# Patient Record
Sex: Male | Born: 1945 | Race: White | Hispanic: No | Marital: Single | Smoking: Former smoker
Health system: Southern US, Community
[De-identification: ages and names within clinical notes are randomized; demographics above are authoritative.]

## PROBLEM LIST (undated history)

## (undated) DIAGNOSIS — I10 Essential (primary) hypertension: Secondary | ICD-10-CM

## (undated) DIAGNOSIS — I2510 Atherosclerotic heart disease of native coronary artery without angina pectoris: Secondary | ICD-10-CM

## (undated) DIAGNOSIS — H9190 Unspecified hearing loss, unspecified ear: Secondary | ICD-10-CM

## (undated) DIAGNOSIS — K219 Gastro-esophageal reflux disease without esophagitis: Secondary | ICD-10-CM

## (undated) DIAGNOSIS — E785 Hyperlipidemia, unspecified: Secondary | ICD-10-CM

## (undated) HISTORY — DX: Essential (primary) hypertension: I10

## (undated) HISTORY — DX: Unspecified hearing loss, unspecified ear: H91.90

## (undated) HISTORY — DX: Hyperlipidemia, unspecified: E78.5

## (undated) HISTORY — DX: Gastro-esophageal reflux disease without esophagitis: K21.9

## (undated) HISTORY — PX: CORONARY ANGIOPLASTY WITH STENT PLACEMENT: SHX49

## (undated) HISTORY — DX: Atherosclerotic heart disease of native coronary artery without angina pectoris: I25.10

## (undated) HISTORY — PX: CARDIAC CATHETERIZATION: SHX172

---

## 2004-08-28 ENCOUNTER — Ambulatory Visit: Payer: Self-pay | Admitting: Cardiology

## 2004-08-29 ENCOUNTER — Ambulatory Visit: Payer: Self-pay | Admitting: Cardiology

## 2004-08-29 ENCOUNTER — Inpatient Hospital Stay (HOSPITAL_COMMUNITY): Admission: AD | Admit: 2004-08-29 | Discharge: 2004-09-01 | Payer: Self-pay | Admitting: Cardiology

## 2004-08-30 ENCOUNTER — Encounter: Payer: Self-pay | Admitting: Cardiology

## 2004-09-10 ENCOUNTER — Ambulatory Visit: Payer: Self-pay | Admitting: Cardiology

## 2004-09-10 ENCOUNTER — Inpatient Hospital Stay (HOSPITAL_COMMUNITY): Admission: AD | Admit: 2004-09-10 | Discharge: 2004-09-15 | Payer: Self-pay | Admitting: Cardiology

## 2004-12-04 ENCOUNTER — Inpatient Hospital Stay (HOSPITAL_COMMUNITY): Admission: AD | Admit: 2004-12-04 | Discharge: 2004-12-09 | Payer: Self-pay | Admitting: Cardiology

## 2004-12-04 ENCOUNTER — Ambulatory Visit: Payer: Self-pay | Admitting: Internal Medicine

## 2004-12-04 ENCOUNTER — Ambulatory Visit: Payer: Self-pay | Admitting: Cardiovascular Disease

## 2005-12-24 IMAGING — CR DG CHEST 1V PORT
1 series · 1 of 1 positions shown · non-contrast
Comparison: none

CLINICAL DATA: Unstable angina. Pre-cath workup.
 PORTABLE CHEST ONE VIEW:  (6476 hours):
 No priors for comparison.
 For this single view, cardiac size is at the upper limits of normal, slightly enlarged. No evidence of acute chest disease or pulmonary vascular congestion.

[view not recorded]
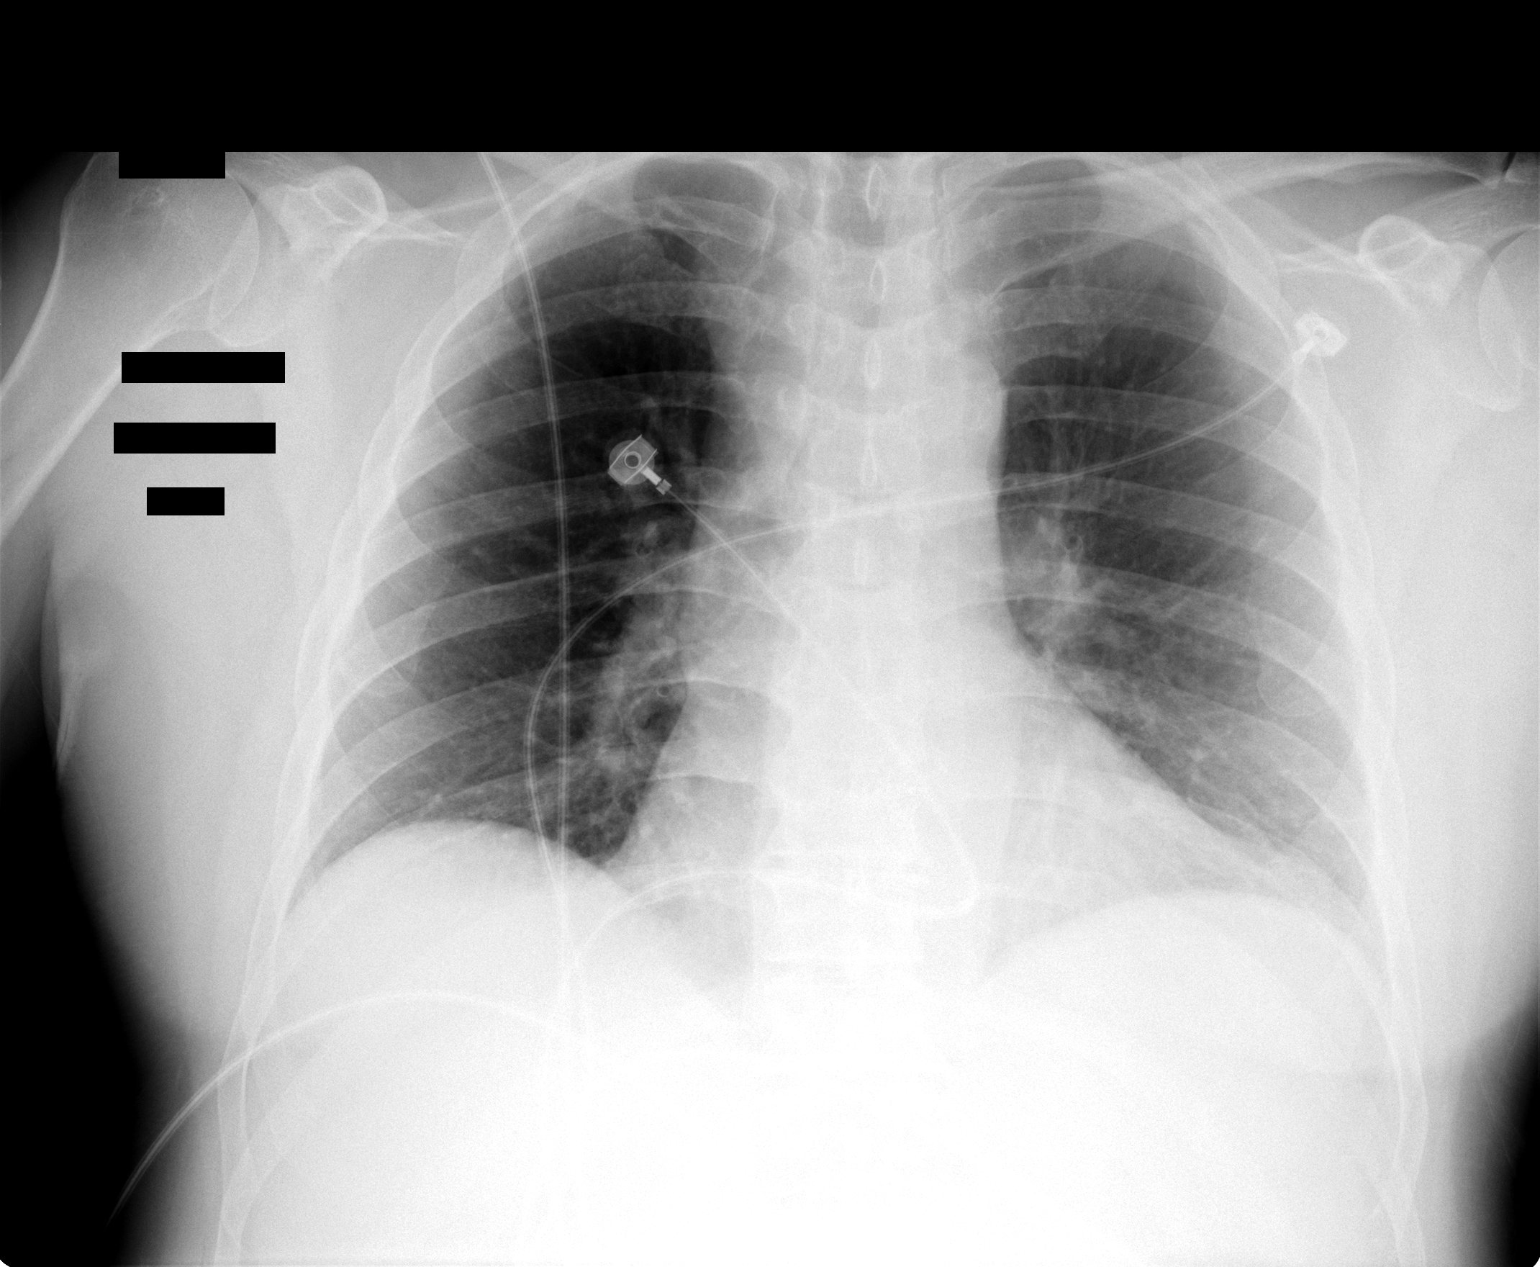

[1 of 1 positions shown; findings below may reference images not displayed]

IMPRESSION: No acute chest disease. Cardiomegaly.

## 2009-04-17 DIAGNOSIS — E109 Type 1 diabetes mellitus without complications: Secondary | ICD-10-CM | POA: Insufficient documentation

## 2010-03-23 ENCOUNTER — Ambulatory Visit: Payer: Self-pay | Admitting: Cardiovascular Disease

## 2010-03-23 ENCOUNTER — Telehealth (INDEPENDENT_AMBULATORY_CARE_PROVIDER_SITE_OTHER): Payer: Self-pay | Admitting: *Deleted

## 2010-03-29 ENCOUNTER — Telehealth: Payer: Self-pay | Admitting: Cardiovascular Disease

## 2010-03-31 ENCOUNTER — Ambulatory Visit: Payer: Self-pay | Admitting: Cardiovascular Disease

## 2010-04-09 ENCOUNTER — Ambulatory Visit: Payer: Self-pay | Admitting: Cardiovascular Disease

## 2010-04-09 ENCOUNTER — Observation Stay (HOSPITAL_COMMUNITY): Admission: EM | Admit: 2010-04-09 | Discharge: 2010-04-10 | Payer: Self-pay | Admitting: Cardiovascular Disease

## 2010-04-26 ENCOUNTER — Telehealth: Payer: Self-pay | Admitting: Cardiovascular Disease

## 2010-04-26 ENCOUNTER — Ambulatory Visit: Payer: Self-pay | Admitting: Cardiovascular Disease

## 2010-10-19 NOTE — Progress Notes (Signed)
  Phone Note Call from Patient   Caller: Patient Call For: nurse Summary of Call: Patient returned call from Thurs. 03/25/10-patient notified per Dr. Kirke Corin, stress test was slightly abnormal.  Needs f/u appt this week.  Per patient has f/u on Wed. 03/31/10.  To keep this appointment. Initial call taken by: Dessie Coma,  March 29, 2010 9:19 AM

## 2010-10-19 NOTE — Progress Notes (Signed)
----   Converted from flag ---- ---- 03/23/2010 3:47 PM, Marilynne Halsted, CMA, AAMA wrote: Pt has [employer], no precert required  ---- 03/23/2010 3:16 PM, Dessie Coma wrote: Nuclear stress test scheduled at  [facility] for 03/25/10 with Dx: 786.50. Thanks, Victorino Dike ------------------------------

## 2010-10-19 NOTE — Progress Notes (Signed)
  Phone Note Outgoing Call   Call placed by: Dessie Coma  LPN,  April 26, 2010 4:02 PM Call placed to: Patient Summary of Call: patient notified per Dr. Kirke Corin to stop taking his protonix.  Patient voiced understanding.

## 2010-10-28 ENCOUNTER — Ambulatory Visit (INDEPENDENT_AMBULATORY_CARE_PROVIDER_SITE_OTHER): Payer: Medicare Other | Admitting: Cardiovascular Disease

## 2010-10-28 DIAGNOSIS — E7800 Pure hypercholesterolemia, unspecified: Secondary | ICD-10-CM

## 2010-10-28 DIAGNOSIS — I2510 Atherosclerotic heart disease of native coronary artery without angina pectoris: Secondary | ICD-10-CM

## 2010-10-28 DIAGNOSIS — I10 Essential (primary) hypertension: Secondary | ICD-10-CM

## 2010-10-29 ENCOUNTER — Telehealth: Payer: Self-pay | Admitting: Cardiovascular Disease

## 2010-11-04 NOTE — Progress Notes (Signed)
Summary: to stay on Ranitidine  Phone Note Call from Patient   Caller: Patient Summary of Call: T.C. from patient-Pantoprazole needing prior authorization.  Has Rx for Ranitidine and is fine with staying on that if he can take twice a day.  Per Dr Kirke Corin, to stay on Ranitidine and take it twice daily.  Patient notified.  l Initial call taken by: Dessie Coma  LPN,  October 29, 2010 10:53 AM

## 2010-12-04 LAB — CBC
HCT: 39.5 % (ref 39.0–52.0)
Hemoglobin: 13.9 g/dL (ref 13.0–17.0)
MCH: 33.1 pg (ref 26.0–34.0)
MCHC: 35.3 g/dL (ref 30.0–36.0)
MCV: 93.8 fL (ref 78.0–100.0)
Platelets: 170 10*3/uL (ref 150–400)
RBC: 4.21 MIL/uL — ABNORMAL LOW (ref 4.22–5.81)
RDW: 12.8 % (ref 11.5–15.5)
WBC: 5.9 10*3/uL (ref 4.0–10.5)

## 2010-12-04 LAB — BASIC METABOLIC PANEL
BUN: 13 mg/dL (ref 6–23)
CO2: 23 mEq/L (ref 19–32)
Calcium: 9 mg/dL (ref 8.4–10.5)
Chloride: 107 mEq/L (ref 96–112)
Creatinine, Ser: 1 mg/dL (ref 0.4–1.5)
GFR calc Af Amer: >60 mL/min (ref >60)
GFR calc non Af Amer: >60 mL/min (ref >60)
Glucose, Bld: 140 mg/dL — ABNORMAL HIGH (ref 70–99)
Potassium: 4 mEq/L (ref 3.5–5.1)
Sodium: 139 mEq/L (ref 135–145)

## 2010-12-10 NOTE — Assessment & Plan Note (Signed)
[facility]                       [place] CARDIOLOGY OFFICE NOTE  NAME:Mccoy, Arin                         MRN:          [id number] DATE:10/28/2010                            DOB:          May 21, 1946   Mr. Mester is a 65 year old male who is here today for a followup visit.  He has the following problem list: 1. Coronary artery disease with a three-vessel coronary artery     disease.  He is status post inferior myocardial infarction in     December 2005.  He is status post angioplasty and a drug-eluting     stent placement to the mid RCA with a Cypher stent followed by     stage LAD angioplasty with a Cypher stent in 2005.  He had     angioplasty with a Taxus stent to the proximal circumflex in 2006.     Most recent cardiac catheterization was done in July 2011.  It     showed 99% in-stent restenosis in the mid LAD with TIMI 2 flow.  He     underwent an angioplasty and drug-eluting stent placement with 2.25     x 15-mm Chromo stent. 2. Hypertension. 3. Hyperlipidemia. 4. Hearing difficulty.  INTERVAL HISTORY:  Mr. Harbaugh is here today for a followup visit. Overall, he has been doing very well with no new symptoms.  He did have recurrent heartburn after he ran out of Protonix.  Otherwise, he has not had any chest pain.  There is no reported dyspnea.  No palpitations, syncope or presyncope.  PHYSICAL EXAMINATION:  VITAL SIGNS:  Weight is 196.8 pounds, blood pressure is 109/65, pulse is 57, oxygen saturation is 96% on room air. NECK:  No JVD or carotid bruits. LUNGS:  Clear to auscultation. HEART:  Regular rate and rhythm with no gallops or murmurs. ABDOMEN:  Benign, nontender, nondistended. EXTREMITIES:  With no clubbing, cyanosis or edema.  IMPRESSION: 1. Coronary artery disease, status post multiple angioplasties and     drug-eluting stents in all 3 coronary arteries.  He is currently     doing well with no recurrent symptoms of angina.   We will continue     lifelong dual antiplatelet therapy with aspirin and Plavix.  We     will continue other heart medications that include metoprolol,     isosorbide and a cholesterol management. 2. Recurrent symptoms of gastroesophageal reflux disease.  We will     resume Protonix 40 mg once daily.  This is the least PPI to have     interaction with Plavix. 3. Hypertension:  Blood pressure is well-controlled. 4. Hyperlipidemia:  His lipid profile is not available for review.     Goal LDL should be less than 70.  He should follow up in 6 months     from now or earlier if needed.    Lorine Bears, MD Electronically Signed   MA/MedQ  DD: 10/28/2010  DT: 10/29/2010  Job #: [id number]

## 2011-02-01 NOTE — Letter (Signed)
March 23, 2010    [facility]  [address]  [address]  [place], [place]  [address]   RE:  Michael, Friedman  MRN:  [id number]  /  DOB:  10-26-1945    REASON FOR CONSULTATION:  Chest pain.   Dear Dr. Orvan Falconer:   Thank you for referring Michael Friedman for cardiac evaluation.  As you are  aware, this is a pleasant 65 year old male who is very hard of hearing.  He has known history of coronary artery disease.  He presented in  December of 2005 with acute inferior myocardial infarction.  At that  time, he underwent angioplasty and stent placement to proximal and mid  right coronary artery with a drug-eluting stent.  He also had a stent  placement to proximal LAD.  He presented in 2006 with recurrent chest  pain and was found to have 70% stenosis in the proximal circumflex.  He  underwent another stent placement at that time.  Since then, he has done  relatively well from a cardiac standpoint.  Most recent stress test was  in March of 2009 which showed evidence of prior inferior infarct with no  significant ischemia.  Most recently the patient started having symptoms  of generalized fatigue, as well as chest pain.  According to him, the  chest pain is happening all the time when he is sitting and not doing  anything.  However, the chest pain does not seem to be happening with  activities.  He describes it as aching.  He denies any heartburn.  He  does have exertional dyspnea as well.  He denies any palpitations or  dizziness.   PAST MEDICAL HISTORY:  1. Coronary artery disease, status post inferior myocardial infarction      in December of 2005.  He underwent angioplasty and a drug-eluting      stent placement to mid RCA with a Cypher stent, 3.5 x 33 mm.  He      also had an LAD angioplasty with Cypher stent placement.  He had a      Taxus stent placement to proximal circumflex in 2006.  This was 3.0      x 16 mm.  2. Preserved LV systolic  function.  3. Hypertension.  4. Hyperlipidemia.  5. Hearing difficulty.  6. No history of diabetes.   MEDICATIONS:  1. WelChol 625 mg 3 tablets twice daily.  2. Isosorbide 30 mg once daily.  3. Ranitidine 150 mg once daily.  4. Metoprolol succinate 25 mg once daily.  5. Lipitor 80 mg at bedtime.  6. Aspirin 81 mg once daily.  7. Plavix 75 mg once daily.   ALLERGIES:  NO KNOWN DRUG ALLERGIES.   SOCIAL HISTORY:  He quit smoking in 1989.  He drinks 2 to 3 beers a day.  He denies any recreational drug use.   FAMILY HISTORY:  Negative for premature coronary artery disease.   PHYSICAL EXAMINATION:  VITAL SIGNS:  Blood pressure is 122/85, heart  rate is 81, weight is 205.2 pounds.  NECK:  Reveals no JVD or carotid bruits.  LUNGS:  Clear to auscultation.  CARDIOVASCULAR:  Normal S1 and S2.  There are no gallops or murmurs.  ABDOMEN:  Benign, nontender, nondistended.  EXTREMITIES:  With no clubbing, cyanosis, or edema.   LABORATORY AND DIAGNOSTIC DATA:  EKG shows normal sinus rhythm with old  inferior infarct.  There are no acute ST or T-wave changes.  His  most  recent lipid profile showed an LDL of 88 and a triglyceride of 163.  His  HDL was 41.   IMPRESSION:  Atypical chest pain with exertional dyspnea:  He has a  known history of 3-vessel coronary artery disease, status post  angioplasty and stent placement to the RCA, LAD, and left circumflex.  All of them were drug-eluting stents.  I agree with lifelong treatment  with a small-dose aspirin and clopidogrel.  He is having some atypical  symptoms that require further evaluation.  In order to risk stratify  him, I recommend proceeding with a nuclear stress test to identify any  areas of ischemia.  Further recommendations will follow after his stress  test.  In the meantime, I agree with  continuous aggressive treatment of  his risk factors.  His blood pressure seems to be under good control.  His lipid profile is above target at  this time.  His LDL ideally should  be less than 70.  However, he is already on a maximum dose of Lipitor  and also using WelChol.  He has a borderline elevation of triglycerides  and borderline low HDL.  I think we will continue with the current  management at this time and recommend aggressive lifestyle changes.  Niaspan can be considered in the future.  He is on good medical therapy  at this time.  I will not add an ACE inhibitor at this time, especially  that his ejection fraction is well preserved according to his most  recent testing.  We will await the results of the stress test.   Thank you for allowing me to participate in the care of your patient.    Sincerely,      Lorine Bears, MD  Electronically Signed    MA/MedQ  DD: 03/23/2010  DT: 03/23/2010  Job #: [id number]

## 2011-02-01 NOTE — Assessment & Plan Note (Signed)
[facility]                         CARDIOLOGY OFFICE NOTE   NAME:Michael Friedman, Michael Friedman                         MRN:          [id number]  DATE:03/31/2010                            DOB:          03-20-1946    PRIMARY CARE PHYSICIAN:  Dr. Orvan Falconer.   INTERVAL HISTORY:  Michael Friedman is here today for followup visit.  I saw  him last week for atypical chest pain.  He does have known history of  coronary artery disease status post coronary artery angioplasty and  stent placements in 2005 can 2006.  His current symptoms are different  from his previous angina.  He is still having hard time keeping details  about his symptoms exactly.  He seems to get few episodes of chest pain,  mostly at rest.  These can happen also at night and when he is lying  down.  He does not get chest pain with activities and when he is working  around the yard.  He has chronic exertional dyspnea.  He does not  exercise regularly.  He does have history of previous GI symptoms and  has been on ranitidine as needed.   PAST MEDICAL HISTORY:  1. Coronary artery disease status post inferior myocardial infarction      in December 2005.  Status post angioplasty and drug-eluting stent      placement to mid RCA with a Cypher stent.  He had a staged LAD      angioplasty with a Cypher stent placement the same year.  He had a      Taxus stent placement to the proximal circumflex in 2006.  2. Preserved LV systolic function.  3. Hypertension.  4. Hyperlipidemia.  5. Hearing difficulty.   CURRENT MEDICATIONS:  1. WelChol 625 mg 3 tablets twice daily.  2. Imdur 30 mg once daily.  3. Ranitidine 150 mg once daily.  4. Metoprolol succinate 25 mg once daily.  5. Lipitor 80 mg at bedtime.  6. Aspirin 81 mg daily.  7. Plavix 75 mg once daily.   PHYSICAL EXAMINATION:  GENERAL APPEARANCE:  The patient is well  nourished and appears to be in no acute distress.  VITAL SIGNS:  Weight is 204.4, blood  pressure in the right arm is  105/66, pulse is 61, oxygen saturation is 96% on room air.  NECK:  Reveals no masses.  There is no carotid bruits.  There is no JVD.  RESPIRATORY:  Normal respiratory effort.  No use of accessory muscles.  Auscultation of lungs reveals normal breath sounds.  CARDIOVASCULAR:  Palpation of the heart reveals normal PMI.  There is  normal S1 and S2 with no gallops or murmurs.  GASTROINTESTINAL:  The exam is benign with no tenderness and no masses.  EXTREMITIES:  No clubbing, cyanosis, or edema.  Peripheral pulses are  slightly diminished.  NEUROLOGIC:  The patient appears to be alert and oriented with no focal  neurologic signs.   IMPRESSION:  1. Atypical chest pain:  The patient has known history of coronary      artery disease status post 3-vessel angioplasty and  stent placement      in 2005 and 2006.  His current symptoms seems to be different from      his previous angina.  He did undergo a nuclear stress test.  There      was no EKG changes during exercise and he had no chest pain at that      time.  In nuclear imaging, there was small apical ischemia with      evidence of prior inferior infarct.  Ejection fraction was normal.      Overall, his stress test is low risk.  Given that his symptoms are      somewhat atypical and a stress test is only slightly abnormal, I      think we will continue with medical therapy for now.  Some of his      symptoms seems to be gastrointestinal in nature and thus, we will      start him on Protonix 40 mg once daily.  I advised him to start an      exercise program and also to start writing down the details about      his symptoms of chest pain given that he is having hard time giving      more detailed history.  The patient will be reevaluated again in 6      weeks from now to ensure resolution of symptoms.  If he continues      to have chest pain, we might ultimately have to proceed with      cardiac catheterization  given his extensive cardiac history.  2. Hyperlipidemia:  Continue with Lipitor and WelChol for now.     Lorine Bears, MD  Electronically Signed    MA/MedQ  DD: 03/31/2010  DT: 03/31/2010  Job #: [id number]   cc:   Dr. Orvan Falconer

## 2011-02-01 NOTE — Assessment & Plan Note (Signed)
[facility]                        [place] CARDIOLOGY OFFICE NOTE   NAME:Mathena, Cammeron                         MRN:          [id number]  DATE:04/26/2010                            DOB:          Jun 16, 1946    PRIMARY CARE PHYSICIAN:  Dr. Orvan Falconer.   INTERVAL HISTORY:  Mr. Kolton was seen by me on March 31, 2010, for a  followup visit.  At that time, his stress test was borderline abnormal.  Initially, it was felt that his symptoms were somewhat atypical and the  plan was to treat him medically and reserve cardiac catheterization for  recurrent symptoms.  However, he presented to [facility] Emergency Room  with chest tightness at rest which was prolonged.  In the emergency room  there, he had negative cardiac enzymes and no new EKG changes.  I was  contacted by the emergency room physician and given his recurrent  symptoms, I decided to transfer him to [facility] for cardiac  catheterization.  Cardiac catheterization was performed and showed 99%  in-stent restenosis in the mid LAD.  He underwent an angioplasty and  drug-eluting stent placement by Dr. Juanda Chance without complications.  Since  then, he feels much better.  Most of his symptoms of chest pain has  resolved.  He denies any dyspnea.  Overall, he has more energy.  His  main complaint at this time is left knee pain and left hip pain which  started a few days ago.  He had this kind of problem before.  He denies  any palpitations, presyncope, or syncope.   PAST MEDICAL HISTORY:  1. Coronary artery disease status post inferior myocardial infarction      in December 2005.  Status post angioplasty and drug-eluting stent      placement to mid RCA with a Cypher stent followed by stage LAD      angioplasty with a Cypher stent in 2005.  He had angioplasty and a      Taxus stent placement to proximal circumflex in 2006.  Most recent      cardiac catheterization was done on April 09, 2010, which showed  99%      in-stent restenosis in the mid LAD with TIMI 2 flow, 30-40%      proximal LAD stenosis, 30-40% proximal circumflex stenosis as well      as 30-40% OM-1 stenosis.  Right coronary artery was dominant and      had 30-40% stenosis in the distal portion of the mid RCA stent.      Ejection fraction was 60%.  He underwent an angioplasty and a drug-      eluting stent placement to mid LAD with 2.25 x 15 mm Promus stent      without complications.  2. Hypertension.  3. Hyperlipidemia.  4. Hearing difficulty.   CURRENT MEDICATIONS:  1. WelChol 625 mg 3 tablets twice daily.  2. Isosorbide 30 mg once daily.  3. Ranitidine 150 mg once daily.  4. Metoprolol succinate 25 mg once daily.  5. Lipitor 80 mg q.h.s.  6. Plavix 75 mg once daily.  7. Protonix 40 mg once daily.  8. Aspirin 325 mg once daily.   ALLERGIES:  No known drug allergies.   PHYSICAL EXAMINATION:  VITAL SIGNS:  Reveals a weight of 203.4 pounds,  blood pressure is 111/74, pulse is 64, oxygen saturation is 96% on room  air.  NECK:  Reveals no JVD or carotid bruits.  LUNGS:  Clear to auscultation.  HEART:  Regular rate and rhythm with no gallops or murmurs.  ABDOMEN:  Benign, nontender, nondistended.  EXTREMITIES:  With no clubbing, cyanosis, or edema.  Right groin reveals  an intact incision site with no hematoma or bruit.   IMPRESSION:  1. Coronary artery disease status post recent angioplasty and drug-      eluting stent placement to mid left anterior descending coronary      artery for severe in-stent restenosis.  He is currently doing much      better.  Most symptoms of chest pain has resolved.  We will      continue with dual-antiplatelet therapy with aspirin and Plavix.      Aspirin can be decreased to 81 mg daily after 3 months.  We will      continue aggressive treatment of risk factors.  I have discussed      with them the importance of cardiac rehab.  He does not think he      can attend.  Thus, I explained  to him that he can do brisk walking      by himself 3-4 times a week with a minimal of 20 minutes at a time.  2. Hyperlipidemia.  He is on high-dose Lipitor and WelChol.  Target      LDL should be less than 70, but he seems to be on maximal therapy      at this time.  If he does not get this checked with Dr. Orvan Falconer,      we can do that next time when he comes for a visit.  3. Hypertension.  Blood pressure is well controlled.  4. Left knee and hip discomfort.  He will follow up with Dr. Orvan Falconer      for evaluation.  We will stop his Protonix at this time given that      most likely the chest pain was due to coronary artery disease and      also due to possible interaction with Plavix.  The patient will      follow up in 6 months from now or earlier if needed.     Lorine Bears, MD  Electronically Signed    MA/MedQ  DD: 04/26/2010  DT: 04/26/2010  Job #: [id number]

## 2011-02-04 NOTE — Cardiovascular Report (Signed)
NAME:  Michael Friedman, Michael Friedman NO.:  [id number]   MEDICAL RECORD NO.:  [id number]          PATIENT TYPE:  INP   LOCATION:  2028                         FACILITY:  [facility]   PHYSICIAN:  Charlies Constable, M.D. [facility] DATE OF BIRTH:  17-Sep-1946   DATE OF PROCEDURE:  09/14/2004  DATE OF DISCHARGE:                              CARDIAC CATHETERIZATION   HISTORY:  Michael Friedman is 65 years old and was hospitalized here on August 29, 2004, after being transferred from [facility] in acute  diaphragmatic myocardial infarction.  He underwent placement of a Cypher  stent in the right coronary artery, and also a Cypher stent in the proximal  mid LAD for 95% stenosis in the LAD.  On September 10, 2004, he developed  recurrent chest pain and was seen in [facility] Emergency Room and  transferred to [place] for further evaluation.  His enzymes were negative and he  is scheduled for cardiac catheterization today.   DESCRIPTION OF PROCEDURE:  The procedure was performed via the right femoral  artery using arterial sheath and 6 French preformed coronary catheters.  _____________Omnipaque contrast was used.  The patient tolerated the  procedure well, and left the laboratory in satisfactory condition.   RESULTS:  1.  The left main coronary artery was free of significant disease.  2.  The left anterior descending artery gave rise to two diagonal branches      and two septal perforators.  There was less than 10% narrowing at this      stent in the mid left anterior descending artery.  3.  The circumflex gave rise to an atrial branch, a marginal branch, and a      posterior lateral branch.  There was 40% narrowing of the proximal      circumflex artery before the take-off of the first marginal branch.  4.  The right coronary artery was a moderate sized vessel that gave rise to      a right ventricular branch, posterior descending branch, and two      posterior lateral branches.  There was a  long stent extending from the      proximal to the mid distal vessel which had 0% stenosis.  An acute      marginal branch which was gelled by the stent had a 70% ostial stenosis.  5.  The left ventriculogram was performed in the RAO projection and showed      hypokinesis of the inferior wall.  The overall wall motion was good with      an estimated ejection fraction of 55%.  The aortic pressure was 154/82,      with a mean of 111, and left ventricular pressure was 154/36.   CONCLUSION:  1.  Coronary artery disease status post recent diaphragmatic wall infarction      on August 29, 2004, treated with stenting of the right coronary artery      and stenting for residual disease in the left anterior descending      artery.  2.  No evidence of restenosis.  Less than 10% stenosis  of the stent site in      the mid left anterior descending artery, 40% narrowing in the proximal      circumflex artery, 0% stenosis at the stent site in the proximal and mid      to distal right coronary artery with 70% ostial stenosis of a gelled      marginal branch, and inferior wall hypokinesis with an estimated      ejection fraction of 55%.   RECOMMENDATIONS:  I do not believe the marginal branch is causing ischemia.  I do not think there is any source of ischemia.  I suspect the patient's  symptoms are non-cardiac.  We will plan reassurance and plan discharge  tomorrow.       BB/MEDQ  D:  09/14/2004  T:  09/14/2004  Job:  [id number]   cc:   Maisie Fus  [address]  [place]  [place] [address]  Fax: [phone]   Heide Guile, MD  [address]  [place]  [place] [address]  Fax: [phone]   Salvadore Farber, M.D. [facility]  [address]  [address]  [place]  [place] [address]

## 2011-02-04 NOTE — H&P (Signed)
NAME:  Michael Friedman, Michael Friedman NO.:  [id number]   MEDICAL RECORD NO.:  [id number]          PATIENT TYPE:  INP   LOCATION:  2931                         FACILITY:  [facility]   PHYSICIAN:  Willa Rough, M.D.     DATE OF BIRTH:  January 12, 1946   DATE OF ADMISSION:  08/29/2004  DATE OF DISCHARGE:                                HISTORY & PHYSICAL   PRIMARY CARE PHYSICIAN:  Dr. Maisie Fus in [place], [place].   CHIEF COMPLAINT:  Inferior ST-elevation MI.   HISTORY OF PRESENT ILLNESS:  The patient is a 65 year old gentleman who was  transferred from [facility] with an acute inferior ST-elevation MI.  The patient reports he has had chest pain on and off for the past 3 years,  but no prior cardiac workup.  He then developed chest pain this morning  around 9:30 which was graded 8/10, radiating to his left arm, and was also  associated with diaphoresis, but no shortness of breath, no nausea or  vomiting.  The pain persisted for approximately 30 minutes and then lapsed  with resting.  Shortly thereafter, he began having another episode of pain  and he has had recurrent episodes for a total of 4-5 episodes up until 4:30  this afternoon.  He then called EMS and was brought to the emergency room at  [facility].  He was found to have an inferior ST-elevation MI by EKG.  He was started on heparin and Integrilin, and called for transfer for to  [facility] for further cardiac evaluation.   PAST MEDICAL HISTORY:  1.  Hyperlipidemia; he was diagnosed approximately 3 years ago, but never      treated with medication.  2.  Sinusitis.  3.  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   MEDICATIONS:  1.  Singulair 1 tablet p.o. daily.  2.  Naprosyn p.r.n.  3.  Zantac p.r.n.   ALLERGIES:  He reports a possible allergy with hives to AUGMENTIN which  occurred last month after being treated for a sinusitis.   SOCIAL HISTORY:  He lives in [place] alone.  He is  retired from the  [employer].  He has never been married.  He has 25-pack-year  history of tobacco; he quit 12 years ago.  He drinks 2 beers a night and  also 1-2 hard drinks per night, denies any drugs.  No herbal medication.  He  tries to follow a low-salt diet and he does not do any regular exercise.   FAMILY HISTORY:  His mom is alive at the age of 68; she has a tobacco abuse  history, but otherwise unremarkable.  Father died at the age of 61, but he  had coronary artery bypass surgery when he was in his 72s.  He has 2  brothers, 1 died from motor vehicle accident, and he has 3 sisters who have  no early CAD.   REVIEW OF SYSTEMS:  He denies any weight changes; no lymphadenopathy, no  fevers or chills, no headaches, no visual changes.  He is markedly hearing-  impaired, as stated  above.  No skin rashes or lesions.  CARDIOPULMONARY:  Per HPI.  No urinary symptoms.  No focal weakness.  No nausea or vomiting.  He does have occasional GERD symptoms.  No pyuria or polydipsia.  All of the  other symptoms are negative.   PHYSICAL EXAM:  VITAL SIGNS:  He is afebrile, temperature 98; pulse 77;  respirations are 20; blood pressure 164/99; saturating 99% on 2 L.  GENERAL:  In general, he is a pleasant man, comfortable, lying in bed, in no  acute distress.  HEENT:  Normocephalic, atraumatic.  Oropharynx is clear.  NECK:  Supple.  Full range of motion.  No lymphadenopathy.  CARDIOVASCULAR:  Normal S1.  Split S2.  Regular rate and rhythm with no  murmurs.  He has 2+ pulses throughout without any bruits.  LUNGS:  Lungs are clear.  ABDOMEN:  Abdomen is soft.  Positive bowel sounds.  No organomegaly.  EXTREMITIES:  Extremities have no edema.  They are cool to touch.  He has 2+  distal pulses.  NEUROLOGIC:  Remarkable for marked hearing impairment.   LABORATORY AND ACCESSORY CLINICAL DATA:  Chest x-ray is benign.   EKG shows sinus bradycardia with first-degree A-V block, inferior  and  lateral ST elevation.   Laboratory data pending.   ASSESSMENT AND PLAN:  1.  The patient is a 65 year old gentleman with history of hyperlipidemia      who is admitted with an inferior ST-elevation myocardial infarction      __________ .  He is taken immediately to the catheterization laboratory.      We will continue to treat him medically with aspirin, Integrilin and      Plavix, post percutaneous intervention.  He was started on atorvastatin      and then metoprolol also.  We will check a fasting lipid panel for risk      assessment and a hemoglobin A1c.  2.  Alcohol:  The patient reports significant alcohol use.  We will start      Ativan and monitor him closely for delirium tremens.  We will give him      multivitamins.       CGF/MEDQ  D:  08/29/2004  T:  08/30/2004  Job:  [id number]

## 2011-02-04 NOTE — Discharge Summary (Signed)
NAME:  Michael Friedman, Michael Friedman NO.:  [id number]   MEDICAL RECORD NO.:  [id number]          PATIENT TYPE:  INP   LOCATION:                               FACILITY:  [facility]   PHYSICIAN:  Rollene Rotunda, M.D.   DATE OF BIRTH:  05-11-1946   DATE OF ADMISSION:  09/10/2004  DATE OF DISCHARGE:  09/15/2004                           DISCHARGE SUMMARY - REFERRING   ADMISSION DATE:  September 10, 2004.   DISCHARGE DATE:  September 15, 2004.   PROCEDURES:  Cardiac catheterization September 14, 2004.   REASON FOR ADMISSION:  Please refer to dictated admission note.   LABORATORY DATA:  Cardiac enzymes:  Normal.  CPK/MB enzymes; marginally  elevated troponin I with peak 0.11 on admission.  Normal liver enzymes.  Hematocrit 38.  Potassium 4.1 and creatinine 1.1 at discharge.   ADMISSION CHEST X-RAY:  No acute disease.   HOSPITAL COURSE:  The patient was admitted for evaluation of recurrent  angina pectoris, similar to his recent presentation in mid December when he  presented with inferior myocardial infarction and underwent two vessel  percutaneous intervention.   The patient was placed on intravenous heparin and nitroglycerin and serial  cardiac markers were cycled, revealing normal CPK/MB fractions with  marginally elevated troponin I (0.11).   The patient continued to have some intermittent chest pain during his return  visit and, in conjunction with mildly elevated troponin markers,  recommendation was to proceed with relook cardiac catheterization.   The patient underwent coronary angiography on September 14, 2004 by Dr. Charlies Constable (see full report for details), revealing less than 10% end-stent  restenosis of the left anterior descending and no-end stent restenosis of  the right coronary artery.  There was 40% proximal circumflex artery  disease.  Left ventricular function was preserved (55%) with inferior  hypokinesis.   Dr. Juanda Chance concluded that there was no source of  ischemia and therefore no  further recommendations were made. The patient was kept for overnight  observation and cleared for discharge the following morning in  hemodynamically stable condition.  He had no further complaint of chest  pain.   MEDICATION ADJUSTMENTS THIS ADMISSION:  Altace 2.5 daily.  The patient will  need a followup metabolic profile in 3-5 days.   DISCHARGE MEDICATIONS:  1.  Altace 2.5 mg q.d..  2. Plavix 75 mg q.d.  3. Aspirin 325 mg q.d.  4.      Lipitor 80 mg q.d.  5. Toprol-XL 100 mg q.d.  6. Singulair one tablet      daily.  7. Nitrostat 0.4 mg p.r.n.   INSTRUCTIONS:  No heavy lifting/straining x2 days.  Maintain a low fat, low  cholesterol diet; call the cardiology office if there is any  swelling/bleeding of the groin.   The patient is instructed to arrange followup with Dr. Gilford Raid Harsh in  approximately two weeks.  He is instructed to have a followup metabolic  profile on Monday, September 20, 2004, for monitoring of renal function with  the addition of Altace.   DISCHARGE DIAGNOSES:  1.  Noncardiac chest pain.  1.  Patent stent sites by cardiac catheterization September 14, 2004.      2.  Marginally elevated troponin markers (peak 0.11).      3.  Status post inferior myocardial infarction/stent left anterior          descending and right coronary artery, August 29, 2004.      4.  Preserved left ventricular function.  2.  Dyslipidemia.  3.  Congenital deafness.  4.  History of alcohol use.       ________________________________________  Rozell Searing, P.A. [facility]  ___________________________________________  Rollene Rotunda, M.D.    GS/MEDQ  D:  09/15/2004  T:  09/15/2004  Job:  [id number]   cc:   Maisie Fus  [address]  [place]  [place] [address]  Fax: [phone]   Heide Guile, MD  [address]  [place]  [place] [address]  Fax: [phone]

## 2011-02-04 NOTE — H&P (Signed)
NAME:  Michael Friedman, Michael Friedman NO.:  [id number]   MEDICAL RECORD NO.:  [id number]          PATIENT TYPE:  INP   LOCATION:  3701                         FACILITY:  [facility]   PHYSICIAN:  Verne Grain, MD   DATE OF BIRTH:  [birth year]   DATE OF ADMISSION:  12/04/2004  DATE OF DISCHARGE:                                HISTORY & PHYSICAL   PHYSICIANS:  Primary cardiologist: Heide Guile, M.D.  Primary care physician:  Maisie Fus, M.D.   CHIEF COMPLAINT:  Chest pain (transferred from [facility] with  recommendations for cardiac catheterization per Dr. Sylvie Farrier).   HISTORY OF PRESENT ILLNESS:  A 65 year old male with history of congential  diabetes mellitus, hyperlipidemia, gastroesophageal reflux disease, alcohol  use, remote tobacco (quit in 1993), positive family history of coronary  artery disease (brother with four-vessel CABG at age 54), positive personal  history of coronary artery disease status post inferior ST-elevation  myocardial infarction (December 2005) treated with Cypher stent to a 99%  right coronary artery and a Cypher stent to a 95% mid LAD, status post  repeat cardiac catheterization for noncardiac chest pain later that month,  had atypical chest pain on and off since his initial MI in December 2005;  however, symptoms had been worse over he past week.  He reports that his  chest pain is worse when he lies flat and better when he gets up and walks  around.  He denies any radiation of the pain, denies any shortness of  breath, nausea, vomiting, or diaphoresis.  He notes the pain is not improved  by nitroglycerin.  The patient also has concomitant left shoulder pain that  is also positional and better when he puts a pillow under his shoulder when  lying down.  Ejection fraction, as determined by echocardiogram in December  2005, was 50 to 55%.  He has been noted to have decreased heart rate with  beta blockers in the past for which recommendations have  been made to  decrease his beta blocker dosage.   PAST MEDICAL HISTORY:  1.  Coronary artery disease status post inferior myocardial infarction      December 2005 status post Cypher stent to 99% right coronary artery and      Cypher stent to 95% mid LAD with recurrent chest pain status post      cardiac catheterization later December 2005 with no obstructive coronary      artery disease (left main no stenosis, LAD less than 10%, left      circumflex 40%, right coronary artery no significant stenoses except for      a jailed origin of an acute marginal approximately 70% stenosis,      ejection fraction 55% with inferior hypokinesis), history of non-cardiac      chest pain (December 2005) status post cardiac catheterization as stated      above.  2.  Positive family history of coronary artery disease with a brother who      underwent four-vessel CABG at age 79 years old.  3.  Hyperlipidemia.  4.  Gastroesophageal reflux disease.  5.  History of low heart rate on beta blockers.  6.  Congenital deafness.  7.  History of alcohol use.  8.  Remote history of tobacco, reactive airway, allergic sinusitis.  (Quit      tobacco in 1993.)   ALLERGIES:  No known drug allergies.   HOME MEDICATIONS:  1.  Naproxen 500 mg p.o. b.i.d.  2.  Toprol XL 50 mg p.o. daily.  3.  Altace 2.5 mg p.o. daily.  4.  Protonix 40 mg p.o. daily.  5.  Aspirin 325 mg p.o. daily.  6.  Lipitor 80 mg p.o. q.h.s.  7.  Plavix 75 mg p.o. daily.  8.  Rhinocort nasal inhaler each nostril daily.  9.  Sublingual nitroglycerin 1 tablet p.o. every 5 minutes p.r.n.   SOCIAL HISTORY:  The patient lives in [place].  He lives alone.  He is not  employed outside of the home.  He has a remote history of tobacco use, quite  in 1993 (25-pack-year history), history of alcohol use greater than two  beers per day.  He denies any illicit drug use.   FAMILY HISTORY:  The patient's mother is alive at age 59.  She is obese with   musculoskeletal problems.  The patient's father died at age 51 with type 2  diabetes mellitus, not requiring insulin.  He also had a stroke with history  of carotid endarterectomy and terminal event, was thought to be pneumonia.  The patient has two brothers and three sisters, all of whom are healthy  except for one brother who recently underwent four-vessel coronary artery  bypass grafting at age 24.   REVIEW OF SYSTEMS:  The patient reports no fevers, chills, sweats, recent  weight change.  He has congenital deafness with no acute changes, no acute  visual limitations.  He reports no acute rash.  He does have chest pain as  described in HPI but denies shortness of breath, dyspnea on exertion, cough,  wheeze, syncope, or presyncope.  He has no overt bilateral bladder  complaints, although he does describe mild chronic constipation and mild  diffuse abdominal discomfort that has persisted for quite some time and  apparently is undergoing some evaluation in his local area.  He reports no  weakness, numbness, mood disturbance.  He has had no complaints of polyuria  or polydipsia, heat or cold intolerance, or other complaints of  endocrinologic abnormality.  All other systems are negative.   PHYSICAL EXAMINATION:  VITAL SIGNS: Temperature 98.1, heart rate 47,  respiratory rate 20, blood pressure 135/83.  GENERAL:  The patient is cooperative, answers questions appropriately.  HEENT:  Normocephalic and atraumatic.  Extraocular eye movements intact.  Oropharynx pink and moist without lesions.  NECK:  Examination is supple.  There is no jugular venous distention.  There  are no carotid bruits.  There is no appreciable lymphadenopathy.  CARDIOVASCULAR:  Regular S1 and regular S2.  There are no murmurs noted.  LUNGS:  Fields are clear to auscultation bilaterally.  SKIN:  Limited examination reveals no acute rash, ecchymoses, phlebotomy  sites. ABDOMEN:  Soft, nontender, nondistended.  Positive  bowel sounds.  Mildly  obese.  EXTREMITIES:  Lower extremity examination reveals no evidence of edema.  Femoral pulses are 2+ and symmetric bilaterally.  There are no femoral  bruits appreciated.  NEUROLOGIC: Grossly nonfocal.  The patient is alert and oriented.  He is  able to move all four extremities without difficulty and is able to ambulate  under his  own power.  Only notable neurologic deficit is hearing loss as  described previously.   LABORATORY DATA AND OTHER STUDIES:  [place] chest x-ray, no acute  abnormality.   [place] EKG:  Heart rate sinus rhythm at a rate of 45 with normal PR, QRS,  QTC intervals.  There are inferior Q waves noted.  There is poor R wave  progression and inferior T wave inversions also noted that appeared stable  on serial EKGs obtained at [facility].   [place] laboratory values: White blood cell count 5.7, hematocrit 43,  platelet count 97.  Sodium 141, potassium 3.9, chloride 106, bicarb 27, BUN  13, creatinine 1.0, glucose 95, anion gap 8.  Total bilirubin 0.8, AST 27,  ALT 45, alkaline phosphatase 99, total protein 6.9, albumin 3.8.  Percent  neutrophils 55%, calcium 8.9.  CK total 53/65/64.  PTT 24.4, PT 10.9, INR  1.0.  Total cholesterol 131, HDL 52, triglycerides 91, LDL 61.   ASSESSMENT AND PLAN:  A 65 year old male, congenitally deaf, hyperlipidemia,  gastroesophageal reflux disease, alcohol use, positive family history of  coronary artery disease and positive personal history of coronary artery  disease status post inferior ST-elevation myocardial infarction status post  left anterior descending and right coronary artery Cypher stents in December  2005 status post cardiac catheterization for noncardiac chest pain later  that month with no obstructive disease seen on repeat catheterization,  presenting with atypical chest pain to [facility] as described in  History of Present Illness status post evaluation by primary  cardiologist  (Dr. Sylvie Farrier) with recommendations for transfer to [facility] for cardiac  catheterization.  The patient's heart rate is in the 40s on Toprol .  The  patient's ejection fraction previously was found to be 50 to 55% (December  2005).  1.  Chest pain.  Continue aspirin, Plavix, Toprol, although will decrease      the dose of metoprolol to 12.5 mg p.o. q.12h. and titrate as tolerated      by heart rate.  Continue Lipitor 80 mg p.o. q.h.s.  Will increase Altace      slightly to 2.5 mg p.o. b.i.d. Will follow creatinine, potassium, as      well as blood pressure.  Sublingual nitroglycerin p.r.n., Lovenox      subcutaneously q.12h. with plans for cardiac catheterization Monday per      Dr. Sylvie Farrier.  Recommendations for weight loss, exercise, alcohol      moderation after discharge.  Will also check thyroid profile, hemoglobin      A1C, amylase, and lipase in light of patient's atypical features of     chest discomfort and chronic abdominal discomfort as mentioned in      History of Present Illness.  2.  Hyperlipidemia.  Lipids are goal on Lipitor 80 mg p.o. q.h.s. with      normal liver function tests.  Will continue Lipitor at this dose.  3.  Heart rate in the 40s.  I will decrease Toprol to 12.5 mg p.o. q.12h. as      stated above.  4.  Congenital deafness.  The patient is able to communicate verbally with      slow speech and some lip reading, allowing him to communicate      effectively.  5.  History of allergic sinusitis.  Continue Rhinocort nasal spray daily as      previously prescribed.  6.  Alcohol use.  Will encourage moderation.  He has no history of  withdrawal.  He has no signs of withdrawal currently.  Will continue to      follow clinically.  7.  Gastroesophageal reflux disease.  Will continue with proton pump      inhibitor therapy as previously prescribed.      DDH/MEDQ  D:  12/04/2004  T:  12/05/2004  Job:  [id number]   cc:   Heide Guile, MD  [address]  [place]  [place] [address]  Fax: [phone]   Maisie Fus, M.D.

## 2011-02-04 NOTE — H&P (Signed)
NAME:  Michael Friedman, Michael Friedman NO.:  [id number]   MEDICAL RECORD NO.:  [id number]          PATIENT TYPE:  INP   LOCATION:  2908                         FACILITY:  [facility]   PHYSICIAN:  Rollene Rotunda, M.D.   DATE OF BIRTH:  25-Nov-1945   DATE OF ADMISSION:  09/10/2004  DATE OF DISCHARGE:                                HISTORY & PHYSICAL   PRIMARY CARE PHYSICIAN:  Maisie Fus, M.D.   REASON FOR ADMISSION:  Evaluate the patient with chest pain.   HISTORY OF PRESENT ILLNESS:  The patient is a 65 year old gentleman with a  history of an acute inferior myocardial infarction on August 29, 2004.  At  that time, he was transferred from [facility] to [facility] where he was found to have normal left main, the LAD 95%, takeoff  of second diagonal, circumflex at 50% proximal stenosis, right coronary  artery at 99% stenosis in the mid-vessel.  The right coronary artery was  stented with a drug-eluding stent.  The LAD was also stented at that time.  The EF was felt to be relatively well preserved.  An echocardiogram was  substandard per images but suggested it to be 55%.   Of note, the patient did have a large enzyme elevation during that  admission.  He also had a brief run of ventricular fibrillation in the  catheterization lab.   He had been doing relatively well at home.  He had been doing some walking  through grocery stores, even taking his medications including his Plavix.  Denied any chest discomfort until today.  He has noticed some mild 1 to 2  out of 10 substernal discomfort.  He stated it was similar to his previous  angina, though not nearly as intense.  He has had no associated nausea,  vomiting, or diaphoresis.  He has had no palpitations.  No syncope or  presyncope.  He said he has felt a little lightheaded.  He thought his  speech was slow and thick, though he denied any other neurologic symptoms.  He had no other focal motor findings.   No visual disturbances.   He presented to [facility].  He did have T-wave inversions in  his inferior and anteroapical leads.  They had no old EKGs for comparison.  He was started on IV nitroglycerin and was referred for further evaluation.   Note, the patient had no fever, chills, or cough.  The pain has not been  positional or worse with inspiration.   PAST MEDICAL HISTORY:  1.  Hyperlipidemia.  2.  Coronary artery disease as described.  3.  Sinusitis.  4.  Congenital deafness.  5.  ETOH use.   ALLERGIES:  AUGMENTIN.   MEDICATIONS:  1.  Plavix 75 mg q.d.  2.  Aspirin 325 mg q.d.  3.  Lipitor 80 mg q.h.s.  4.  Metoprolol 100 mg q.d.  5.  Nitroglycerin.  6.  Singulair.   SOCIAL HISTORY:  Lives in [place] alone.  He is retired.  Never been  married.  He quit smoking 12 years ago  after a 25-pack-year history.  He  used to drink three or four drinks and now he is down to two beers or two  alcohol drinks per night.   FAMILY HISTORY:  Contributory for his father having bypass in his 65s.  Otherwise, he has no first degree relatives with early heart disease.   REVIEW OF SYMPTOMS:  As stated in the HPI.  Otherwise negative for other  systems.   PHYSICAL EXAMINATION:  GENERAL:  The patient is in no distress.  VITAL SIGNS:  Heart rate 52 and regular, blood pressure 92/60.  HEENT:  Eyes unremarkable.  Pupils equal, round, and reactive.  Fundi  visualized.  Oral mucosa unremarkable.  NECK:  No jugular venous distention.  Wave form within normal limits.  Carotid upstroke brisk and symmetric. No bruits.  No thyromegaly.  LYMPHATICS:  No cervical, axillary, or inguinal adenopathy.  LUNGS:  Clear to auscultation bilaterally.  BACK:  No costovertebral angle tenderness.  CHEST:  Unremarkable.  PMI not displaced or sustained.  S1 and S2 within  normal limits.  No S3, S4, murmurs.  ABDOMEN:  Flat, positive bowel sounds.  Normal in frequency and pitch.  No  bruits, rebound,  guarding, or midline pulsatile.  No hepatosplenomegaly.  SKIN:  No rashes.  No nodules.  EXTREMITIES:  There are 2+ pulses.  Mild right groin ecchymosis.  No bruit  and no hematoma.  No cyanosis or clubbing.  NEUROLOGICAL:  Oriented to person, place, and time.  Cranial nerves II  through XII grossly intact.  Motor grossly intact.   LABORATORY DATA:  EKG shows sinus rhythm, rate 58, axis within normal  limits.  Old inferior infarct.  Inferior T-wave inversions in II, III, and  aVF.  T-wave inversion in V4 through V6.  No change from previous EKGs.   ASSESSMENT/PLAN:  1.  Chest pain:  The patient's chest pain is very mild.  The      electrocardiogram is not acutely changed.  At this point, I will him out      for a myocardial infarction.  He will be treated with nitrates and      heparin.  If his enzymes are negative and there are no electrocardiogram      changes in follow up and he has no residual chest pain, I      would not suggest further cardiovascular testing but would continue with      aggressive risk reduction.  If he has any of the above, then      catheterization would be warranted.  2.  Risk reduction:  He will continue with his Statin.       JH/MEDQ  D:  09/10/2004  T:  09/11/2004  Job:  [id number]   cc:   Maisie Fus  [address]  [place]  [place] [address]  Fax: [phone]   Heide Guile, MD  [address]  [place]  [place] [address]  Fax: [phone]

## 2011-02-04 NOTE — Discharge Summary (Signed)
NAME:  Michael Friedman, Michael Friedman NO.:  [id number]   MEDICAL RECORD NO.:  [id number]          PATIENT TYPE:  INP   LOCATION:  3740                         FACILITY:  [facility]   PHYSICIAN:  Salvadore Farber, M.D. LHCDATE OF BIRTH:  21-Oct-1945   DATE OF ADMISSION:  08/29/2004  DATE OF DISCHARGE:                           DISCHARGE SUMMARY - REFERRING   HISTORY OF PRESENT ILLNESS:  The patient is a 65 year old white male who was  transferred from [facility] with an acute inferior ST-segment  elevation myocardial infarction.  He described his chest discomfort  intermittently for the last three years, but has not had a prior cardiac  workup.  On the morning of presentation, at approximately 9:30 a.m., he  developed chest discomfort radiating to his left arm, associated with  diaphoresis.  With rest, the discomfort abated; however, he had another  episode and continued to have recurrent episodes, for a total of five, until  4:30 p.m.  Thus he called EMS and was brought to the emergency room at  [facility] for evaluation.  With his electrocardiogram changes, he  was transferred here to [facility] emergently.   PAST MEDICAL HISTORY:  1.  Notable for hyperlipidemia diagnosed three years ago, but he has not      been treated with medication.  2.  Chronic sinusitis.  3.  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   LABORATORY DATA:  A chest x-ray from [facility] is not available, nor  is laboratory data available.  An electrocardiogram at [facility] showed baseline artifact, sinus  bradycardia with a rate of 53, with inferior ST-segment elevation.  Subsequent electrocardiograms at [facility] showed normal  sinus rhythm, improved ST-segment elevation inferolaterally.  At [facility] an admission H&H of 13.7 and 38.9  respectively, normal indices, platelets 203, WBC's 9.0.  Subsequent  hematologies were unremarkable, the last being drawn on August 30, 2004,  of 13.8 and 39.5 respectively, normal indices, platelets 235, WBC's 7.2.  Admission sodium 136, potassium 3.7, BUN 14, creatinine 1.0.  His AST was  81, ALT 44, with low protein and albumin.  Subsequent chemistries were  unremarkable except for continued elevation of his liver function tests;  however, prior to discharge the sodium was 141, potassium 4.6, BUN 12,  creatinine 1.1, glucose 103.  Total bilirubin 1.7, alkaline phosphatase 86,  AST 103, ALT 59, total protein and albumin 6.3 and 3.5 respectively.  Liver  function tests were improved from prior.  Hemoglobin A1c on August 29, 2004, was 6.0.  On August 30, 2004, the fasting lipids showed a total  cholesterol of 215, triglycerides 111, HDL 55, LDL 138.  TSH 0.925.  Admission CK total and MB was 853 and 90.5 respectively, with a relative  index of 10.6 and a troponin of 4.73.  The second CK total peaked at 1890  with an MB of 261.3, relative index of 13.8.  Subsequent enzymes were  declining.  The next troponin was drawn on August 30, 2004, and was 50.28.  Subsequent troponins were declining.  HOSPITAL COURSE:  The patient was transferred and brought emergently to the  cardiac catheterization laboratory.  The cardiac catheterization was  performed by Dr. Salvadore Farber.  According to Dr. Melinda Crutch progress  note, he had a 50% mid-circumflex, a 95% mid-LAD, a 99% mid-RCA, with a TIMI-  1 flow.  Dr. Samule Ohm first performed stenting to the RCA utilizing a CYPHER  stent.  He then performed a CYPHER stenting to the mid-LAD.  He was  maintained on Integrilin and Dr. Samule Ohm noted that the patient should be  kept on Plavix for one year and on aspirin indefinitely.  Post-sheath  removal and bedrest the patient was ambulating without difficulty, and  cardiac rehabilitation assisted with education and ambulation.  An  echocardiogram was also performed to  determine LV function.  This was  performed on August 23, 2004, and showed an ejection fraction of 50%-55%.  It was inadequate for evaluation of wall motion abnormalities.  There was  mild left ventricular dysfunction.  It was noted that during the cardiac  catheterization when the lesions were addressed, it was complicated by  ventricular fibrillation and symptomatic sinus bradycardia.  During  admission Lipitor was also started with his hyperlipidemia and coronary  artery disease.  During admission his elevated liver function tests were  felt secondary to his myocardial infarction, possibly related to his ETOH,  but is significantly improved prior to discharge.   DISCHARGE DIAGNOSES:  1.  Inferior myocardial infarction, status post CYPHER stenting to the right      coronary artery and the left anterior descending coronary artery,      complicated by sinus bradycardia and ventricular fibrillation.  2.  Hyperlipidemia, with the initiation of statin.  3.  Hypertension, improved.  4.  Elevated liver function tests, improved.  5.  ETOH use.  6.  History as previously.   DISPOSITION:  The patient is discharged home.   DISCHARGE MEDICATIONS:  1.  His new medications include aspirin 325 mg daily.  2.  Plavix 75 mg daily for at least one year.  3.  Lipitor 80 mg q.h.s.  4.  Toprol XL 100 mg daily.  5.  Nitroglycerin 0.4 mg p.r.n.  6.  He was given permission to resume the Singulair.  7.  Resume Naprosyn.  8.  Resume Zantac, which he was taking prior to admission.   DISCHARGE INSTRUCTIONS:  1.  He was advised no lifting, driving, sexual activity, or heavy exertion      until seen by the physician.  2.  To maintain a low-salt, low-fat, low-cholesterol diet.  3.  If he has any problems with his cardiac catheterization site, he is      asked to call Dr. Phoebe Perch immediately.  4.  He was instructed to limit his alcohol intake to two beers per day, or     two ounces of liquor per day, but  not both.   FOLLOWUP:  He will call Dr. Doreen Beam office this afternoon to arrange a two-  week follow-up appointment.  At the time of followup, consideration should  be given to obtaining a CMP to follow up on his liver function tests.  He  will need fasting lipids and liver function tests in approximately six to  eight weeks, since Lipitor was initiated.      Emil   EW/MEDQ  D:  09/01/2004  T:  09/01/2004  Job:  [id number]   cc:   [place] Sopala, M.D.  [place], [place]   [place].  Phoebe Perch, M.D.  [address]  [place]  [place] [address]  Fax: [phone]   Dr. Phoebe Perch

## 2011-02-04 NOTE — Cardiovascular Report (Signed)
NAME:  Michael Friedman, NARANG NO.:  [id number]   MEDICAL RECORD NO.:  [id number]          PATIENT TYPE:  INP   LOCATION:  3701                         FACILITY:  [facility]   PHYSICIAN:  Arturo Morton. Riley Kill, M.D. [facility] OF BIRTH:  01/03/1946   DATE OF PROCEDURE:  12/07/2003  DATE OF DISCHARGE:                              CARDIAC CATHETERIZATION   INDICATIONS:  [name] is a 65 year old who has had previous percutaneous  intervention involving both the LAD and right coronary.  He had moderate  stenosis of the circumflex.  He has had some discomfort ever since his  procedure.  However, the symptoms seem to be worse lying down and seen to be  better when he is up and about.  The discomfort worsened over the past week  or so.  He apparently had an ultrasound done in [place], although the  results of this are unavailable to [place].  He was brought to the  catheterization laboratory for further evaluation and treatment.   PROCEDURES:  1.  Left heart catheterization  2.  Selective coronary territory.  3.  Selective left ventriculography.   DESCRIPTION OF PROCEDURE:  The procedure was performed from the right  femoral artery using 6-French catheters.  He tolerated procedure well.  There were no complications.  I brought Dr. Juanda Chance and Dr. Gerri Spore to  review the films in the laboratory.  The patient has a moderate stenosis of  the circumflex which looks slightly worse than on the previous study,  however, given his symptoms I discussed this with the patient. We agreed  that perhaps endoscopy prior to further decisions regarding the circumflex  would be in order.  We will contact GI and try to  obtain results of his ultrasound.  Following this, then we may elect to do  intravascular ultrasound combined by flow wire to assess the severity of the  circumflex lesion.  I would be reluctant that radionuclide imaging would  highlight this area well.      TDS/MEDQ  D:  12/06/2004  T:   12/06/2004  Job:  [id number]   cc:   Maisie Fus  [address]  [place]  [place] [address]  Fax: [phone]   Heide Guile, MD  [address]  [place]  [place] [address]  Fax: [phone]   Cardiac catheterization lab

## 2011-02-04 NOTE — Cardiovascular Report (Signed)
NAME:  Michael Friedman, Michael Friedman NO.:  [id number]   MEDICAL RECORD NO.:  [id number]          PATIENT TYPE:  INP   LOCATION:  6526                         FACILITY:  [facility]   PHYSICIAN:  Charlies Constable, M.D. [facility] DATE OF BIRTH:  28-Jul-1946   DATE OF PROCEDURE:  12/08/2004  DATE OF DISCHARGE:                              CARDIAC CATHETERIZATION   CLINICAL HISTORY:  [name] is 65 years old and has a previous acute  diaphragmatic wall infarction treated with CYPHER stent in the right  coronary artery and also had a CYPHER stent placed in the LAD.  He was  recently admitted with chest pain and was studied earlier by Dr. Riley Kill and  was found to have a lesion in the circumflex artery which is thought to be  70 to 80%.  He was evaluated with endoscopy which did not show any major  cause for his symptoms and the decision was made to proceed with IVUS and  probable stenting of the lesion today.   PROCEDURE:  The procedure was performed by the right femoral artery and  arterial sheath and a 6 [place] CLS4 guiding catheter with side holes.  Patient was given Angiomax bolus and infusion and was given 300 mg of  Plavix.  We used a Prowater wire and crossed the lesion in the proximal  circumflex artery with the wire without difficulty.  We performed  intervascular ultrasound measurements with the Atlantis catheter and  automatic pullback.  This demonstrated a tight lesion and we made a decision  to proceed with stenting.  We direct stented the lesion with a 3.0 x 16 mm  TAXUS stent deploying this with one inflation of 14 atmospheres for 30  seconds.  We then post dilated with a 3.5 x 12 mm Quantum Maverick  performing two inflations for 16 and 14 atmospheres for 30 seconds avoiding  the distal edge.  Repeat diagnostic studies were then performed through the  guiding catheter.  The patient tolerated the procedure well and left the  laboratory in satisfactory condition.   RESULTS:  The  IVUS measurements showed that the lesion in the proximal  circumflex artery at its smallest dimension was 1.4 x 1.9 mm.  The distal  reference lumen was 3.0 mm in diameter and the proximal reference lumen was  3.5 mm in diameter.   Following stenting of the lesion, the stenosis improved from 80% to 0%.   CONCLUSION:  Successful stenting of the lesion in the proximal circumflex  artery using a TAXUS drug eluting stent with improvement in percent of  narrowing from 80% to 0%.   DISPOSITION:  Patient returned post anesthesia care unit for further  observation.      BB/MEDQ  D:  12/08/2004  T:  12/08/2004  Job:  [id number]   cc:   Heide Guile, MD  [address]  [place]  [place] [address]  Fax: [phone]   Arturo Morton. Riley Kill, M.D. [facility]   Bertram Gala Sopala  [address]  [place]  [id number]  Fax: [phone]

## 2011-02-04 NOTE — Cardiovascular Report (Signed)
NAME:  Michael Friedman, Michael Friedman NO.:  [id number]   MEDICAL RECORD NO.:  [id number]          PATIENT TYPE:  INP   LOCATION:  2931                         FACILITY:  [facility]   PHYSICIAN:  Salvadore Farber, M.D. LHCDATE OF BIRTH:  1945/10/10   DATE OF PROCEDURE:  08/29/2004  DATE OF DISCHARGE:                              CARDIAC CATHETERIZATION   PROCEDURE:  1.  Coronary angiography.  2.  Placement of temporary pacing wire.  3.  Drug-eluting stent placement in the RCA.  4.  Drug-eluting stent placement in the LAD.  5.  Angio-Seal closure of the right common femoral arteriotomy site.   INDICATION:  [name] is a 65 year old gentleman without prior history of  cardiovascular disease.  Risk factors are significant for age, sex,  borderline diabetes and dyslipidemia.  He had stuttering chest discomfort  this morning which became severe and fixed at approximately 4 a.m.  He  presented to [facility] where electrocardiogram demonstrated inferior  ST elevations.  He was treated with aspirin, heparin, eptifibatide and  transferred for urgent catheterization with an eye to percutaneous  revascularization.  Upon arrival, he had ongoing chest discomfort and was  hemodynamically stable.   PROCEDURAL TECHNIQUE:  Informed consent was obtained.  The patient consent  to participate in the APEX trial comparing a myosite protective drug versus  placebo.   Under 1% lidocaine local anesthesia, a 7-French sheath was placed in the  right common femoral artery and a 6-French sheath in the right common  femoral vein using a modified Seldinger technique.  Diagnostic angiography  was performed using JL-4 and JR-4 catheters.  Pigtail catheter was then  advanced into the left ventricle.  Immediately upon advancing into the left  ventricle, the patient developed ventricular fibrillation.  This lasted for  less than ten seconds and was self-terminating.  During the VF, the pigtail  was  removed from the ventricle.  I decided to defer left heart  catheterization.   Diagnostic images demonstrated the culprit lesion to be a 99% stenosis of  the mid RCA with TIMI1 flow to the distal vasculature.  There was also a 95%  stenosis of the mid LAD.  I decided to proceed with percutaneous  revascularization of both lesions.   The patient was continued on eptifibatide.  An additional bolus was given to  make for a total of two boluses.  Plavix 600 mg was administered.  Additional heparin was given to achieve and maintain an ACT of greater than  200 seconds.   Attention was first turned to the RCA.  A 7-French ART 3.5 guide was  advanced over a wire and engaged in the ostium of the right.  APEX study  drug was administered.  A Prowater wire was advanced to the distal vessel  without difficulty.  The lesion was directly stented using a 3.5 x 33 mm  Cypher at 16 atmospheres.  This resulted in reperfusion.  With reperfusion,  he developed severe bradycardia and hypotension.  Pacing wire was advanced  into the right ventricle via the venous sheath.  Capture was confirmed.  Shortly thereafter, he regained a spontaneous rhythm and the pacer was set  to a backup mode.  The stent was then post dilated using a 4.0 x 18 cm Power  Sail at 16 atmospheres at each end and 18 atmospheres in the middle.  There  were kinks at both ends of the stent.  These did not appear to be  dissections.  Nitroglycerin was administered with improvement but not  complete resolution.  There was TIMI 3 flow to the distal vasculature and  less than 20% impingement upon the lumen by these lesions.  I elected  therefore not to place additional stents.  Final angiography demonstrated no  residual stenosis and TIMI 3 flow to the distal vasculature.  ST's improved  markedly.   Attention was then turned to the LAD.  I elected to go ahead and treat the  LAD because electrocardiogram had suggested substantial anterior  ischemia,  and the patient did have ongoing chest discomfort.  Though there was robust  flow into the distal RCA, there was no collateralization of the LAD.   A 7-French CLS-4 guide was advanced over a wire and engaged in the ostium of  the left main.  A Prowater wire was advanced into the distal LAD without  difficulty.  The lesion was directly stented using a 2.5 x 13 mm Cypher at  16 atmospheres.  The distal portion of the stent was post dilated using a  2.5 x 8 mm Quantum at 16 atmospheres in the proximal portion using a 2.75 x  8 mm Quantum at 20 atmospheres.  Final angiography demonstrated no residual  stenosis, TIMI 3 flow to the distal vasculature, and mild stenosis in the JL  diagonal branch.   I then returned to the RCA to confirm that spasm and kinking at the stent  edges was no worse than it had been.  A JR-4 diagnostic catheter was  advanced and used to selectively engage the RCA.  This continued to show 20-  30% impingement with no evidence of dissection and TIMI 3 flow to the distal  vasculature.  Since this was by no means flow-limiting, I elected to manage  it conservatively.   COMPLICATIONS:  Ventricular fibrillation, symptomatic bradycardia, both  transient.   FINDINGS:  1.  Left main:  Angiographically normal.  2.  LAD:  Large vessel giving rise to a small first and larger second      diagonal branch.  The LAD had a 95% stenosis just after the takeoff of      the second diagonal.  3.  Circumflex:  Moderate size vessel giving rise to a single obtuse      marginal.  There is a 50% stenosis just proximal to the takeoff of the      marginal.  4.  RCA:  Large, dominant vessel.  There was a 99% stenosis in the mid      vessel with TIMI 1 flow.  This was stented to no residual stenosis with      TIMI 3 flow using a drug-eluting stent.   IMPRESSION/PLAN:  Successful drug-eluting stent placement in the culprit lesion of the RCA and also in the severe stenosis of the LAD  with excellent  angiographic and clinical reperfusion of the culprit territory.  The patient  will be continued on Integrilin for 18 hours.  Plavix should be continued  for at least a year.  Aspirin should be continued indefinitely.  Beta  blocker and statin will be initiated.  Will   WED/MEDQ  D:  08/29/2004  T:  08/30/2004  Job:  [id number]   cc:   Heide Guile, MD  [address]  [place]  [place] [address]  Fax: [phone]

## 2011-02-04 NOTE — Discharge Summary (Signed)
NAME:  Michael Friedman, Michael Friedman NO.:  [id number]   MEDICAL RECORD NO.:  [id number]          PATIENT TYPE:  INP   LOCATION:  6526                         FACILITY:  [facility]   PHYSICIAN:  Rollene Rotunda, M.D.   DATE OF BIRTH:  06-15-1946   DATE OF ADMISSION:  12/04/2004  DATE OF DISCHARGE:  12/09/2004                                 DISCHARGE SUMMARY   PROCEDURES:  1.  Cardiac catheterization.  2.  Coronary arteriogram.  3.  Left ventriculogram.  4.  Recatheterization with PTCA and TAXUS stent.  5.  EGD.   DISCHARGE DIAGNOSES:  1.  Chest pain, status post percutaneous transluminal coronary angioplasty      and TAXUS stent to the circumflex with intervascular ultrasound.  2.  Possible gastroesophageal reflux disease symptoms, status post      esophagogastroduodenoscopy without significant abnormality.  3.  Hyperlipidemia.  4.  Bradycardia, beta-blocker decreased.  5.  History of sinusitis.  6.  History of alcohol use with three to four beers a day.  7.  Congenital deafness.  8.  Status post percutaneous transluminal coronary angioplasty and stent      previously to the left anterior descending and right coronary artery.  9.  History of cerebrovascular accident.   HOSPITAL COURSE:  Michael Friedman is a 65 year old male with known coronary  artery disease.  He came to the hospital for chest pain on December 04, 2004,  and was admitted for further evaluation.   He had a catheterization on December 06, 2004, that showed single vessel  disease but it was not clearly flow limiting so GI consult was called.  He  was seen by GI and decision was made to do an EGD.  This was performed on  December 07, 2004.  There was no obvious cause for chest pain found on the  examination so the films were reviewed by Dr. Riley Kill and  the decision was  made with the patient to intervene on the circumflex.  A TAXUS stent was  placed to the circumflex on December 08, 2004, reducing the stenosis from 80%  to  0.   The next day, his post procedure enzymes were negative.  He had no chest  pain or shortness of breath.   On admission, Michael Friedman was found to have some bradycardia.  His heart rate  was initially 45 and he was in sinus rhythm.  His Toprol XL was cut back to  50 and then 25 mg a day.  He was dropping briefly into the 40s when he was  asleep but otherwise his heart rate was in the 50s or better.  He was  asymptomatic with this.  He is to go home on Toprol XL 25 mg a day.  He is  considered stable for discharge on December 09, 2004.   DISCHARGE INSTRUCTIONS:  1.  He is to do no driving for two days and no strenuous activity for a      week.  2.  He is to stick to a low fat diet and limit beer to one daily.  3.  He is to call the office for problems with the cath site and not take a      tub bath for a week and no MRI for two months.  4.  He is to follow up with Dr. Rebecca Eaton as needed and see Dr. Sylvie Farrier within      two weeks.   DISCHARGE MEDICATIONS:  1.  Aspirin 325 mg daily.  2.  Plavix 75 mg daily for one year.  3.  Altace 2.5 mg daily.  4.  Lipitor 80 mg daily.  5.  Rhinocort as prior to admission.  6.  Nitroglycerin p.r.n.  7.  Protonix 40 mg daily.  8.  Naproxen 500 mg two times a day as needed.  Avoid if possible while on      aspirin and Plavix.  9.  Toprol XL 25 mg a day.      RB/MEDQ  D:  12/09/2004  T:  12/09/2004  Job:  [id number]   cc:   Heide Guile, MD  [address]  [place]  [place] [address]  Fax: [phone]   Maisie Fus  [address]  [place]  [id number]  Fax: [phone]

## 2011-04-18 ENCOUNTER — Encounter: Payer: Self-pay | Admitting: Cardiovascular Disease

## 2011-05-02 ENCOUNTER — Encounter: Payer: Self-pay | Admitting: Cardiovascular Disease

## 2011-05-02 ENCOUNTER — Ambulatory Visit (INDEPENDENT_AMBULATORY_CARE_PROVIDER_SITE_OTHER): Payer: Medicare Other | Admitting: Cardiovascular Disease

## 2011-05-02 DIAGNOSIS — E785 Hyperlipidemia, unspecified: Secondary | ICD-10-CM | POA: Insufficient documentation

## 2011-05-02 DIAGNOSIS — I2510 Atherosclerotic heart disease of native coronary artery without angina pectoris: Secondary | ICD-10-CM

## 2011-05-02 DIAGNOSIS — I10 Essential (primary) hypertension: Secondary | ICD-10-CM

## 2011-05-02 NOTE — Progress Notes (Signed)
HPI  This is a 65 year old male who is here today for a followup visit. He has known history of three-vessel coronary artery disease status post angioplasty and drug-eluting stent placement. Most recent cardiac catheterization was in July of 2011 when he presented with unstable angina. At that time it showed 99% in-stent restenosis in the LAD. He had an angioplasty and drug-eluting stent placement without complications. He has been doing well. During his last visit, he complained of heartburn. I started him on Protonix. He was seen by gastroenterology. He underwent an EGD and colonoscopy. He was switched to omeprazole. He has been doing very well. He denies any chest pain or dyspnea. There is no palpitations or dizziness.  No Known Allergies   No current outpatient prescriptions on file prior to visit.     Past Medical History  Diagnosis Date  . Hearing difficulty   . GERD (gastroesophageal reflux disease)   . CAD (coronary artery disease)     3 vessel CAD. s/p PCI and DES to RCA, LCX and LAD  . HLD (hyperlipidemia)   . HTN (hypertension)      Past Surgical History  Procedure Date  . Cardiac catheterization     multiple. Most recent in July of 2011: 99% in-stent restenosis in the mid LAD  . Coronary angioplasty with stent placement     Cypher stent to RCA and LAD. Taxus stent to LCx. Instent restenosis and LAD was treated with a Promus drug-eluting stent     History reviewed. No pertinent family history.   History   Social History  . Marital Status: Single    Spouse Name: N/A    Number of Children: N/A  . Years of Education: N/A   Occupational History  . Not on file.   Social History Main Topics  . Smoking status: Former Smoker    Quit date: 09/20/1987  . Smokeless tobacco: Not on file  . Alcohol Use: 10.8 oz/week    18 Cans of beer per week  . Drug Use: No  . Sexually Active: Not on file   Other Topics Concern  . Not on file   Social History Narrative  . No  narrative on file       PHYSICAL EXAM   BP 114/74  Pulse 59  Ht 5\' 5"  (1.651 m)  Wt 203 lb (92.08 kg)  BMI 33.78 kg/m2  SpO2 97%  Constitutional: He is oriented to person, place, and time. He appears well-developed and well-nourished. No distress.  HENT: No nasal discharge.  Head: Normocephalic and atraumatic.  Eyes: Pupils are equal, round, and reactive to light. Right eye exhibits no discharge. Left eye exhibits no discharge.  Neck: Normal range of motion. Neck supple. No JVD present. No thyromegaly present.  Cardiovascular: Normal rate, regular rhythm, normal heart sounds and intact distal pulses. Exam reveals no gallop and no friction rub.  No murmur heard.  Pulmonary/Chest: Effort normal and breath sounds normal. No stridor. No respiratory distress. He has no wheezes. He has no rales. He exhibits no tenderness.  Abdominal: Soft. Bowel sounds are normal. He exhibits no distension. There is no tenderness. There is no rebound and no guarding.  Musculoskeletal: Normal range of motion. He exhibits no edema and no tenderness.  Neurological: He is alert and oriented to person, place, and time. Coordination normal.  Skin: Skin is warm and dry. No rash noted. He is not diaphoretic. No erythema. No pallor.  Psychiatric: He has a normal mood and affect. His behavior  is normal. Judgment and thought content normal.      EKG: Sinus bradycardia with low voltage. Poor R wave progression in the anterior leads. No significant change from previous ECG.   ASSESSMENT AND PLAN

## 2011-05-02 NOTE — Assessment & Plan Note (Signed)
The patient is doing very well at this time with no recurrent symptoms of angina. Continue with dual antiplatelet therapy indefinitely due to multiple drug-eluting stents. I advised him to decrease the dose of aspirin to 81 mg once daily. Continue aggressive lifestyle changes.

## 2011-05-02 NOTE — Assessment & Plan Note (Signed)
Continue high dose atorvastatin. He gets his lipids checked with Dr. Orvan Falconer. Goal LDL is less than 70.

## 2011-05-02 NOTE — Assessment & Plan Note (Signed)
His blood pressure is well controlled. Continue current medications. 

## 2011-05-02 NOTE — Progress Notes (Signed)
Addended by: Reine Just on: 05/02/2011 12:46 PM   Modules accepted: Orders

## 2011-05-02 NOTE — Patient Instructions (Signed)
Your physician recommends that you schedule a follow-up appointment in: 6 months  

## 2011-05-18 ENCOUNTER — Encounter: Payer: Self-pay | Admitting: Cardiovascular Disease

## 2011-06-13 ENCOUNTER — Encounter: Payer: Self-pay | Admitting: Cardiovascular Disease

## 2011-09-26 DIAGNOSIS — R05 Cough: Secondary | ICD-10-CM | POA: Diagnosis not present

## 2011-09-26 DIAGNOSIS — F5102 Adjustment insomnia: Secondary | ICD-10-CM | POA: Diagnosis not present

## 2011-09-26 DIAGNOSIS — J310 Chronic rhinitis: Secondary | ICD-10-CM | POA: Diagnosis not present

## 2011-09-30 DIAGNOSIS — M109 Gout, unspecified: Secondary | ICD-10-CM | POA: Diagnosis not present

## 2011-09-30 DIAGNOSIS — E785 Hyperlipidemia, unspecified: Secondary | ICD-10-CM | POA: Diagnosis not present

## 2011-09-30 DIAGNOSIS — I10 Essential (primary) hypertension: Secondary | ICD-10-CM | POA: Diagnosis not present

## 2011-09-30 DIAGNOSIS — I2510 Atherosclerotic heart disease of native coronary artery without angina pectoris: Secondary | ICD-10-CM | POA: Diagnosis not present

## 2011-10-03 DIAGNOSIS — Z87891 Personal history of nicotine dependence: Secondary | ICD-10-CM | POA: Diagnosis not present

## 2011-10-03 DIAGNOSIS — I2510 Atherosclerotic heart disease of native coronary artery without angina pectoris: Secondary | ICD-10-CM | POA: Diagnosis not present

## 2011-10-03 DIAGNOSIS — Z79899 Other long term (current) drug therapy: Secondary | ICD-10-CM | POA: Diagnosis not present

## 2011-10-03 DIAGNOSIS — R079 Chest pain, unspecified: Secondary | ICD-10-CM | POA: Diagnosis not present

## 2011-10-03 DIAGNOSIS — K219 Gastro-esophageal reflux disease without esophagitis: Secondary | ICD-10-CM | POA: Diagnosis not present

## 2011-10-03 DIAGNOSIS — R072 Precordial pain: Secondary | ICD-10-CM | POA: Diagnosis not present

## 2011-10-03 DIAGNOSIS — M109 Gout, unspecified: Secondary | ICD-10-CM | POA: Diagnosis not present

## 2011-10-03 DIAGNOSIS — E785 Hyperlipidemia, unspecified: Secondary | ICD-10-CM | POA: Diagnosis not present

## 2011-10-03 DIAGNOSIS — Z7982 Long term (current) use of aspirin: Secondary | ICD-10-CM | POA: Diagnosis not present

## 2011-10-03 DIAGNOSIS — I495 Sick sinus syndrome: Secondary | ICD-10-CM | POA: Diagnosis not present

## 2011-10-03 DIAGNOSIS — R0602 Shortness of breath: Secondary | ICD-10-CM | POA: Diagnosis not present

## 2011-10-03 DIAGNOSIS — I10 Essential (primary) hypertension: Secondary | ICD-10-CM | POA: Diagnosis not present

## 2011-10-03 DIAGNOSIS — I252 Old myocardial infarction: Secondary | ICD-10-CM | POA: Diagnosis not present

## 2011-10-04 DIAGNOSIS — M109 Gout, unspecified: Secondary | ICD-10-CM | POA: Diagnosis not present

## 2011-10-04 DIAGNOSIS — R072 Precordial pain: Secondary | ICD-10-CM | POA: Diagnosis not present

## 2011-10-04 DIAGNOSIS — E785 Hyperlipidemia, unspecified: Secondary | ICD-10-CM | POA: Diagnosis not present

## 2011-10-04 DIAGNOSIS — K219 Gastro-esophageal reflux disease without esophagitis: Secondary | ICD-10-CM | POA: Diagnosis not present

## 2011-10-04 DIAGNOSIS — R0602 Shortness of breath: Secondary | ICD-10-CM | POA: Diagnosis not present

## 2011-10-04 DIAGNOSIS — Z9861 Coronary angioplasty status: Secondary | ICD-10-CM | POA: Diagnosis not present

## 2011-10-04 DIAGNOSIS — R0609 Other forms of dyspnea: Secondary | ICD-10-CM | POA: Diagnosis not present

## 2011-10-04 DIAGNOSIS — I10 Essential (primary) hypertension: Secondary | ICD-10-CM | POA: Diagnosis not present

## 2011-10-04 DIAGNOSIS — I2510 Atherosclerotic heart disease of native coronary artery without angina pectoris: Secondary | ICD-10-CM | POA: Diagnosis not present

## 2011-10-04 DIAGNOSIS — I252 Old myocardial infarction: Secondary | ICD-10-CM | POA: Diagnosis not present

## 2011-10-04 DIAGNOSIS — Z7982 Long term (current) use of aspirin: Secondary | ICD-10-CM | POA: Diagnosis not present

## 2011-10-04 DIAGNOSIS — Z79899 Other long term (current) drug therapy: Secondary | ICD-10-CM | POA: Diagnosis not present

## 2011-10-05 DIAGNOSIS — Z9861 Coronary angioplasty status: Secondary | ICD-10-CM | POA: Diagnosis not present

## 2011-10-05 DIAGNOSIS — R0602 Shortness of breath: Secondary | ICD-10-CM | POA: Diagnosis not present

## 2011-10-05 DIAGNOSIS — I252 Old myocardial infarction: Secondary | ICD-10-CM | POA: Diagnosis not present

## 2011-10-05 DIAGNOSIS — R0609 Other forms of dyspnea: Secondary | ICD-10-CM | POA: Diagnosis not present

## 2011-10-05 DIAGNOSIS — R072 Precordial pain: Secondary | ICD-10-CM | POA: Diagnosis not present

## 2011-10-05 DIAGNOSIS — I2510 Atherosclerotic heart disease of native coronary artery without angina pectoris: Secondary | ICD-10-CM | POA: Diagnosis not present

## 2011-10-07 DIAGNOSIS — M76899 Other specified enthesopathies of unspecified lower limb, excluding foot: Secondary | ICD-10-CM | POA: Diagnosis not present

## 2011-10-07 DIAGNOSIS — M1990 Unspecified osteoarthritis, unspecified site: Secondary | ICD-10-CM | POA: Diagnosis not present

## 2011-10-07 DIAGNOSIS — E119 Type 2 diabetes mellitus without complications: Secondary | ICD-10-CM | POA: Diagnosis not present

## 2011-10-07 DIAGNOSIS — M25569 Pain in unspecified knee: Secondary | ICD-10-CM | POA: Diagnosis not present

## 2011-10-07 DIAGNOSIS — M7150 Other bursitis, not elsewhere classified, unspecified site: Secondary | ICD-10-CM | POA: Diagnosis not present

## 2011-10-17 DIAGNOSIS — Z23 Encounter for immunization: Secondary | ICD-10-CM | POA: Diagnosis not present

## 2011-10-19 DIAGNOSIS — Z79899 Other long term (current) drug therapy: Secondary | ICD-10-CM | POA: Diagnosis not present

## 2011-10-19 DIAGNOSIS — M109 Gout, unspecified: Secondary | ICD-10-CM | POA: Diagnosis not present

## 2011-10-24 DIAGNOSIS — I2510 Atherosclerotic heart disease of native coronary artery without angina pectoris: Secondary | ICD-10-CM | POA: Diagnosis not present

## 2011-10-24 DIAGNOSIS — E785 Hyperlipidemia, unspecified: Secondary | ICD-10-CM | POA: Diagnosis not present

## 2011-10-24 DIAGNOSIS — I10 Essential (primary) hypertension: Secondary | ICD-10-CM | POA: Diagnosis not present

## 2011-10-24 DIAGNOSIS — K219 Gastro-esophageal reflux disease without esophagitis: Secondary | ICD-10-CM | POA: Diagnosis not present

## 2011-10-31 DIAGNOSIS — J310 Chronic rhinitis: Secondary | ICD-10-CM | POA: Diagnosis not present

## 2011-10-31 DIAGNOSIS — F5102 Adjustment insomnia: Secondary | ICD-10-CM | POA: Diagnosis not present

## 2011-10-31 DIAGNOSIS — R05 Cough: Secondary | ICD-10-CM | POA: Diagnosis not present

## 2012-01-30 DIAGNOSIS — I739 Peripheral vascular disease, unspecified: Secondary | ICD-10-CM | POA: Diagnosis not present

## 2012-01-30 DIAGNOSIS — E785 Hyperlipidemia, unspecified: Secondary | ICD-10-CM | POA: Diagnosis not present

## 2012-01-30 DIAGNOSIS — E119 Type 2 diabetes mellitus without complications: Secondary | ICD-10-CM | POA: Diagnosis not present

## 2012-01-30 DIAGNOSIS — I2510 Atherosclerotic heart disease of native coronary artery without angina pectoris: Secondary | ICD-10-CM | POA: Diagnosis not present

## 2012-01-30 DIAGNOSIS — I10 Essential (primary) hypertension: Secondary | ICD-10-CM | POA: Diagnosis not present

## 2012-01-30 DIAGNOSIS — Z125 Encounter for screening for malignant neoplasm of prostate: Secondary | ICD-10-CM | POA: Diagnosis not present

## 2012-01-31 DIAGNOSIS — J310 Chronic rhinitis: Secondary | ICD-10-CM | POA: Diagnosis not present

## 2012-01-31 DIAGNOSIS — F5102 Adjustment insomnia: Secondary | ICD-10-CM | POA: Diagnosis not present

## 2012-02-09 DIAGNOSIS — G4730 Sleep apnea, unspecified: Secondary | ICD-10-CM | POA: Diagnosis not present

## 2012-02-09 DIAGNOSIS — G4710 Hypersomnia, unspecified: Secondary | ICD-10-CM | POA: Diagnosis not present

## 2012-02-27 DIAGNOSIS — G4700 Insomnia, unspecified: Secondary | ICD-10-CM | POA: Diagnosis not present

## 2012-02-27 DIAGNOSIS — G4730 Sleep apnea, unspecified: Secondary | ICD-10-CM | POA: Diagnosis not present

## 2012-02-27 DIAGNOSIS — J310 Chronic rhinitis: Secondary | ICD-10-CM | POA: Diagnosis not present

## 2012-03-01 DIAGNOSIS — K769 Liver disease, unspecified: Secondary | ICD-10-CM | POA: Diagnosis not present

## 2012-03-01 DIAGNOSIS — E871 Hypo-osmolality and hyponatremia: Secondary | ICD-10-CM | POA: Diagnosis not present

## 2012-03-01 DIAGNOSIS — M25569 Pain in unspecified knee: Secondary | ICD-10-CM | POA: Diagnosis not present

## 2012-03-12 DIAGNOSIS — IMO0002 Reserved for concepts with insufficient information to code with codable children: Secondary | ICD-10-CM | POA: Diagnosis not present

## 2012-03-27 DIAGNOSIS — M25469 Effusion, unspecified knee: Secondary | ICD-10-CM | POA: Diagnosis not present

## 2012-03-27 DIAGNOSIS — M25569 Pain in unspecified knee: Secondary | ICD-10-CM | POA: Diagnosis not present

## 2012-03-27 DIAGNOSIS — IMO0002 Reserved for concepts with insufficient information to code with codable children: Secondary | ICD-10-CM | POA: Diagnosis not present

## 2012-03-27 DIAGNOSIS — M7040 Prepatellar bursitis, unspecified knee: Secondary | ICD-10-CM | POA: Diagnosis not present

## 2012-03-27 DIAGNOSIS — M7120 Synovial cyst of popliteal space [Baker], unspecified knee: Secondary | ICD-10-CM | POA: Diagnosis not present

## 2012-04-06 DIAGNOSIS — M25569 Pain in unspecified knee: Secondary | ICD-10-CM | POA: Diagnosis not present

## 2012-04-06 DIAGNOSIS — M1710 Unilateral primary osteoarthritis, unspecified knee: Secondary | ICD-10-CM | POA: Diagnosis not present

## 2012-04-06 DIAGNOSIS — M6580 Other synovitis and tenosynovitis, unspecified site: Secondary | ICD-10-CM | POA: Diagnosis not present

## 2012-04-06 DIAGNOSIS — M23302 Other meniscus derangements, unspecified lateral meniscus, unspecified knee: Secondary | ICD-10-CM | POA: Diagnosis not present

## 2012-07-02 DIAGNOSIS — E871 Hypo-osmolality and hyponatremia: Secondary | ICD-10-CM | POA: Diagnosis not present

## 2012-07-02 DIAGNOSIS — K769 Liver disease, unspecified: Secondary | ICD-10-CM | POA: Diagnosis not present

## 2012-07-02 DIAGNOSIS — Z6829 Body mass index (BMI) 29.0-29.9, adult: Secondary | ICD-10-CM | POA: Diagnosis not present

## 2012-07-02 DIAGNOSIS — E785 Hyperlipidemia, unspecified: Secondary | ICD-10-CM | POA: Diagnosis not present

## 2012-07-02 DIAGNOSIS — I10 Essential (primary) hypertension: Secondary | ICD-10-CM | POA: Diagnosis not present

## 2012-07-02 DIAGNOSIS — Z23 Encounter for immunization: Secondary | ICD-10-CM | POA: Diagnosis not present

## 2012-11-05 DIAGNOSIS — E538 Deficiency of other specified B group vitamins: Secondary | ICD-10-CM | POA: Diagnosis not present

## 2012-11-05 DIAGNOSIS — E559 Vitamin D deficiency, unspecified: Secondary | ICD-10-CM | POA: Diagnosis not present

## 2012-11-05 DIAGNOSIS — I2510 Atherosclerotic heart disease of native coronary artery without angina pectoris: Secondary | ICD-10-CM | POA: Diagnosis not present

## 2012-11-05 DIAGNOSIS — IMO0001 Reserved for inherently not codable concepts without codable children: Secondary | ICD-10-CM | POA: Diagnosis not present

## 2012-11-05 DIAGNOSIS — Z23 Encounter for immunization: Secondary | ICD-10-CM | POA: Diagnosis not present

## 2012-11-05 DIAGNOSIS — I10 Essential (primary) hypertension: Secondary | ICD-10-CM | POA: Diagnosis not present

## 2013-03-04 DIAGNOSIS — I739 Peripheral vascular disease, unspecified: Secondary | ICD-10-CM | POA: Diagnosis not present

## 2013-03-04 DIAGNOSIS — E538 Deficiency of other specified B group vitamins: Secondary | ICD-10-CM | POA: Diagnosis not present

## 2013-03-04 DIAGNOSIS — IMO0001 Reserved for inherently not codable concepts without codable children: Secondary | ICD-10-CM | POA: Diagnosis not present

## 2013-03-04 DIAGNOSIS — I10 Essential (primary) hypertension: Secondary | ICD-10-CM | POA: Diagnosis not present

## 2013-03-04 DIAGNOSIS — I2510 Atherosclerotic heart disease of native coronary artery without angina pectoris: Secondary | ICD-10-CM | POA: Diagnosis not present

## 2013-03-04 DIAGNOSIS — Z125 Encounter for screening for malignant neoplasm of prostate: Secondary | ICD-10-CM | POA: Diagnosis not present

## 2013-07-04 DIAGNOSIS — Z23 Encounter for immunization: Secondary | ICD-10-CM | POA: Diagnosis not present

## 2013-07-04 DIAGNOSIS — M25519 Pain in unspecified shoulder: Secondary | ICD-10-CM | POA: Diagnosis not present

## 2013-07-04 DIAGNOSIS — E785 Hyperlipidemia, unspecified: Secondary | ICD-10-CM | POA: Diagnosis not present

## 2013-07-04 DIAGNOSIS — E1159 Type 2 diabetes mellitus with other circulatory complications: Secondary | ICD-10-CM | POA: Diagnosis not present

## 2013-07-04 DIAGNOSIS — I10 Essential (primary) hypertension: Secondary | ICD-10-CM | POA: Diagnosis not present

## 2013-07-04 DIAGNOSIS — I2510 Atherosclerotic heart disease of native coronary artery without angina pectoris: Secondary | ICD-10-CM | POA: Diagnosis not present

## 2013-07-04 DIAGNOSIS — E559 Vitamin D deficiency, unspecified: Secondary | ICD-10-CM | POA: Diagnosis not present

## 2013-07-04 DIAGNOSIS — E538 Deficiency of other specified B group vitamins: Secondary | ICD-10-CM | POA: Diagnosis not present

## 2013-07-05 DIAGNOSIS — I259 Chronic ischemic heart disease, unspecified: Secondary | ICD-10-CM | POA: Diagnosis not present

## 2013-07-05 DIAGNOSIS — R109 Unspecified abdominal pain: Secondary | ICD-10-CM | POA: Diagnosis not present

## 2013-07-05 DIAGNOSIS — R079 Chest pain, unspecified: Secondary | ICD-10-CM | POA: Diagnosis not present

## 2013-07-05 DIAGNOSIS — K859 Acute pancreatitis without necrosis or infection, unspecified: Secondary | ICD-10-CM | POA: Diagnosis not present

## 2013-07-05 DIAGNOSIS — E119 Type 2 diabetes mellitus without complications: Secondary | ICD-10-CM | POA: Diagnosis not present

## 2013-07-05 DIAGNOSIS — R072 Precordial pain: Secondary | ICD-10-CM | POA: Diagnosis not present

## 2013-07-05 DIAGNOSIS — N289 Disorder of kidney and ureter, unspecified: Secondary | ICD-10-CM | POA: Diagnosis not present

## 2013-07-06 DIAGNOSIS — R9431 Abnormal electrocardiogram [ECG] [EKG]: Secondary | ICD-10-CM | POA: Diagnosis not present

## 2013-07-14 DIAGNOSIS — R079 Chest pain, unspecified: Secondary | ICD-10-CM | POA: Diagnosis not present

## 2013-07-15 DIAGNOSIS — G9340 Encephalopathy, unspecified: Secondary | ICD-10-CM | POA: Diagnosis not present

## 2013-07-17 DIAGNOSIS — E119 Type 2 diabetes mellitus without complications: Secondary | ICD-10-CM | POA: Diagnosis not present

## 2013-07-17 DIAGNOSIS — E860 Dehydration: Secondary | ICD-10-CM | POA: Diagnosis not present

## 2013-07-17 DIAGNOSIS — I999 Unspecified disorder of circulatory system: Secondary | ICD-10-CM | POA: Diagnosis not present

## 2013-07-17 DIAGNOSIS — I2510 Atherosclerotic heart disease of native coronary artery without angina pectoris: Secondary | ICD-10-CM | POA: Diagnosis not present

## 2013-08-04 DIAGNOSIS — E119 Type 2 diabetes mellitus without complications: Secondary | ICD-10-CM | POA: Diagnosis not present

## 2013-08-04 DIAGNOSIS — K219 Gastro-esophageal reflux disease without esophagitis: Secondary | ICD-10-CM | POA: Diagnosis not present

## 2013-08-04 DIAGNOSIS — Z9181 History of falling: Secondary | ICD-10-CM | POA: Diagnosis not present

## 2013-08-04 DIAGNOSIS — F329 Major depressive disorder, single episode, unspecified: Secondary | ICD-10-CM | POA: Diagnosis not present

## 2013-08-04 DIAGNOSIS — Z602 Problems related to living alone: Secondary | ICD-10-CM | POA: Diagnosis not present

## 2013-08-04 DIAGNOSIS — F1021 Alcohol dependence, in remission: Secondary | ICD-10-CM | POA: Diagnosis not present

## 2013-08-04 DIAGNOSIS — F411 Generalized anxiety disorder: Secondary | ICD-10-CM | POA: Diagnosis not present

## 2013-08-04 DIAGNOSIS — I2510 Atherosclerotic heart disease of native coronary artery without angina pectoris: Secondary | ICD-10-CM | POA: Diagnosis not present

## 2013-08-04 DIAGNOSIS — Z8719 Personal history of other diseases of the digestive system: Secondary | ICD-10-CM | POA: Diagnosis not present

## 2013-08-05 DIAGNOSIS — Z8719 Personal history of other diseases of the digestive system: Secondary | ICD-10-CM | POA: Diagnosis not present

## 2013-08-05 DIAGNOSIS — I2510 Atherosclerotic heart disease of native coronary artery without angina pectoris: Secondary | ICD-10-CM | POA: Diagnosis not present

## 2013-08-05 DIAGNOSIS — F329 Major depressive disorder, single episode, unspecified: Secondary | ICD-10-CM | POA: Diagnosis not present

## 2013-08-05 DIAGNOSIS — E119 Type 2 diabetes mellitus without complications: Secondary | ICD-10-CM | POA: Diagnosis not present

## 2013-08-05 DIAGNOSIS — K219 Gastro-esophageal reflux disease without esophagitis: Secondary | ICD-10-CM | POA: Diagnosis not present

## 2013-08-05 DIAGNOSIS — F411 Generalized anxiety disorder: Secondary | ICD-10-CM | POA: Diagnosis not present

## 2013-08-08 DIAGNOSIS — E119 Type 2 diabetes mellitus without complications: Secondary | ICD-10-CM | POA: Diagnosis not present

## 2013-08-08 DIAGNOSIS — F329 Major depressive disorder, single episode, unspecified: Secondary | ICD-10-CM | POA: Diagnosis not present

## 2013-08-08 DIAGNOSIS — I2510 Atherosclerotic heart disease of native coronary artery without angina pectoris: Secondary | ICD-10-CM | POA: Diagnosis not present

## 2013-08-08 DIAGNOSIS — Z8719 Personal history of other diseases of the digestive system: Secondary | ICD-10-CM | POA: Diagnosis not present

## 2013-08-08 DIAGNOSIS — F411 Generalized anxiety disorder: Secondary | ICD-10-CM | POA: Diagnosis not present

## 2013-08-08 DIAGNOSIS — K219 Gastro-esophageal reflux disease without esophagitis: Secondary | ICD-10-CM | POA: Diagnosis not present

## 2013-08-12 DIAGNOSIS — F411 Generalized anxiety disorder: Secondary | ICD-10-CM | POA: Diagnosis not present

## 2013-08-12 DIAGNOSIS — E119 Type 2 diabetes mellitus without complications: Secondary | ICD-10-CM | POA: Diagnosis not present

## 2013-08-12 DIAGNOSIS — I2510 Atherosclerotic heart disease of native coronary artery without angina pectoris: Secondary | ICD-10-CM | POA: Diagnosis not present

## 2013-08-12 DIAGNOSIS — Z8719 Personal history of other diseases of the digestive system: Secondary | ICD-10-CM | POA: Diagnosis not present

## 2013-08-12 DIAGNOSIS — F329 Major depressive disorder, single episode, unspecified: Secondary | ICD-10-CM | POA: Diagnosis not present

## 2013-08-12 DIAGNOSIS — K219 Gastro-esophageal reflux disease without esophagitis: Secondary | ICD-10-CM | POA: Diagnosis not present

## 2013-08-14 DIAGNOSIS — I2510 Atherosclerotic heart disease of native coronary artery without angina pectoris: Secondary | ICD-10-CM | POA: Diagnosis not present

## 2013-08-14 DIAGNOSIS — K219 Gastro-esophageal reflux disease without esophagitis: Secondary | ICD-10-CM | POA: Diagnosis not present

## 2013-08-14 DIAGNOSIS — F411 Generalized anxiety disorder: Secondary | ICD-10-CM | POA: Diagnosis not present

## 2013-08-14 DIAGNOSIS — F329 Major depressive disorder, single episode, unspecified: Secondary | ICD-10-CM | POA: Diagnosis not present

## 2013-08-14 DIAGNOSIS — E119 Type 2 diabetes mellitus without complications: Secondary | ICD-10-CM | POA: Diagnosis not present

## 2013-08-14 DIAGNOSIS — Z8719 Personal history of other diseases of the digestive system: Secondary | ICD-10-CM | POA: Diagnosis not present

## 2013-08-20 DIAGNOSIS — F329 Major depressive disorder, single episode, unspecified: Secondary | ICD-10-CM | POA: Diagnosis not present

## 2013-08-20 DIAGNOSIS — E119 Type 2 diabetes mellitus without complications: Secondary | ICD-10-CM | POA: Diagnosis not present

## 2013-08-20 DIAGNOSIS — F411 Generalized anxiety disorder: Secondary | ICD-10-CM | POA: Diagnosis not present

## 2013-08-20 DIAGNOSIS — K219 Gastro-esophageal reflux disease without esophagitis: Secondary | ICD-10-CM | POA: Diagnosis not present

## 2013-08-20 DIAGNOSIS — Z8719 Personal history of other diseases of the digestive system: Secondary | ICD-10-CM | POA: Diagnosis not present

## 2013-08-20 DIAGNOSIS — I2510 Atherosclerotic heart disease of native coronary artery without angina pectoris: Secondary | ICD-10-CM | POA: Diagnosis not present

## 2013-08-28 DIAGNOSIS — F411 Generalized anxiety disorder: Secondary | ICD-10-CM | POA: Diagnosis not present

## 2013-08-28 DIAGNOSIS — I2510 Atherosclerotic heart disease of native coronary artery without angina pectoris: Secondary | ICD-10-CM | POA: Diagnosis not present

## 2013-08-28 DIAGNOSIS — F329 Major depressive disorder, single episode, unspecified: Secondary | ICD-10-CM | POA: Diagnosis not present

## 2013-08-28 DIAGNOSIS — E119 Type 2 diabetes mellitus without complications: Secondary | ICD-10-CM | POA: Diagnosis not present

## 2013-08-28 DIAGNOSIS — Z8719 Personal history of other diseases of the digestive system: Secondary | ICD-10-CM | POA: Diagnosis not present

## 2013-08-28 DIAGNOSIS — K219 Gastro-esophageal reflux disease without esophagitis: Secondary | ICD-10-CM | POA: Diagnosis not present

## 2013-09-04 DIAGNOSIS — K219 Gastro-esophageal reflux disease without esophagitis: Secondary | ICD-10-CM | POA: Diagnosis not present

## 2013-09-04 DIAGNOSIS — E119 Type 2 diabetes mellitus without complications: Secondary | ICD-10-CM | POA: Diagnosis not present

## 2013-09-04 DIAGNOSIS — F329 Major depressive disorder, single episode, unspecified: Secondary | ICD-10-CM | POA: Diagnosis not present

## 2013-09-04 DIAGNOSIS — Z8719 Personal history of other diseases of the digestive system: Secondary | ICD-10-CM | POA: Diagnosis not present

## 2013-09-04 DIAGNOSIS — F411 Generalized anxiety disorder: Secondary | ICD-10-CM | POA: Diagnosis not present

## 2013-09-04 DIAGNOSIS — I2510 Atherosclerotic heart disease of native coronary artery without angina pectoris: Secondary | ICD-10-CM | POA: Diagnosis not present

## 2013-09-12 DIAGNOSIS — R112 Nausea with vomiting, unspecified: Secondary | ICD-10-CM | POA: Diagnosis not present

## 2013-09-12 DIAGNOSIS — E119 Type 2 diabetes mellitus without complications: Secondary | ICD-10-CM | POA: Diagnosis not present

## 2013-09-12 DIAGNOSIS — R197 Diarrhea, unspecified: Secondary | ICD-10-CM | POA: Diagnosis not present

## 2013-09-12 DIAGNOSIS — I2510 Atherosclerotic heart disease of native coronary artery without angina pectoris: Secondary | ICD-10-CM | POA: Diagnosis not present

## 2013-09-12 DIAGNOSIS — E7800 Pure hypercholesterolemia, unspecified: Secondary | ICD-10-CM | POA: Diagnosis not present

## 2013-09-12 DIAGNOSIS — K219 Gastro-esophageal reflux disease without esophagitis: Secondary | ICD-10-CM | POA: Diagnosis not present

## 2013-09-12 DIAGNOSIS — N281 Cyst of kidney, acquired: Secondary | ICD-10-CM | POA: Diagnosis not present

## 2013-09-12 DIAGNOSIS — K5289 Other specified noninfective gastroenteritis and colitis: Secondary | ICD-10-CM | POA: Diagnosis not present

## 2013-09-12 DIAGNOSIS — R110 Nausea: Secondary | ICD-10-CM | POA: Diagnosis not present

## 2013-09-12 DIAGNOSIS — Z79899 Other long term (current) drug therapy: Secondary | ICD-10-CM | POA: Diagnosis not present

## 2013-09-12 DIAGNOSIS — N433 Hydrocele, unspecified: Secondary | ICD-10-CM | POA: Diagnosis not present

## 2013-09-12 DIAGNOSIS — R1110 Vomiting, unspecified: Secondary | ICD-10-CM | POA: Diagnosis not present

## 2013-09-13 DIAGNOSIS — F329 Major depressive disorder, single episode, unspecified: Secondary | ICD-10-CM | POA: Diagnosis not present

## 2013-09-13 DIAGNOSIS — K219 Gastro-esophageal reflux disease without esophagitis: Secondary | ICD-10-CM | POA: Diagnosis not present

## 2013-09-13 DIAGNOSIS — F411 Generalized anxiety disorder: Secondary | ICD-10-CM | POA: Diagnosis not present

## 2013-09-13 DIAGNOSIS — I2510 Atherosclerotic heart disease of native coronary artery without angina pectoris: Secondary | ICD-10-CM | POA: Diagnosis not present

## 2013-09-13 DIAGNOSIS — Z8719 Personal history of other diseases of the digestive system: Secondary | ICD-10-CM | POA: Diagnosis not present

## 2013-09-13 DIAGNOSIS — E119 Type 2 diabetes mellitus without complications: Secondary | ICD-10-CM | POA: Diagnosis not present

## 2013-09-18 DIAGNOSIS — K219 Gastro-esophageal reflux disease without esophagitis: Secondary | ICD-10-CM | POA: Diagnosis not present

## 2013-09-18 DIAGNOSIS — Z8719 Personal history of other diseases of the digestive system: Secondary | ICD-10-CM | POA: Diagnosis not present

## 2013-09-18 DIAGNOSIS — E119 Type 2 diabetes mellitus without complications: Secondary | ICD-10-CM | POA: Diagnosis not present

## 2013-09-18 DIAGNOSIS — F329 Major depressive disorder, single episode, unspecified: Secondary | ICD-10-CM | POA: Diagnosis not present

## 2013-09-18 DIAGNOSIS — I2510 Atherosclerotic heart disease of native coronary artery without angina pectoris: Secondary | ICD-10-CM | POA: Diagnosis not present

## 2013-09-18 DIAGNOSIS — F411 Generalized anxiety disorder: Secondary | ICD-10-CM | POA: Diagnosis not present

## 2013-09-26 DIAGNOSIS — F3289 Other specified depressive episodes: Secondary | ICD-10-CM | POA: Diagnosis not present

## 2013-09-26 DIAGNOSIS — Z8719 Personal history of other diseases of the digestive system: Secondary | ICD-10-CM | POA: Diagnosis not present

## 2013-09-26 DIAGNOSIS — F411 Generalized anxiety disorder: Secondary | ICD-10-CM | POA: Diagnosis not present

## 2013-09-26 DIAGNOSIS — I2510 Atherosclerotic heart disease of native coronary artery without angina pectoris: Secondary | ICD-10-CM | POA: Diagnosis not present

## 2013-09-26 DIAGNOSIS — F329 Major depressive disorder, single episode, unspecified: Secondary | ICD-10-CM | POA: Diagnosis not present

## 2013-09-26 DIAGNOSIS — E119 Type 2 diabetes mellitus without complications: Secondary | ICD-10-CM | POA: Diagnosis not present

## 2013-09-26 DIAGNOSIS — K219 Gastro-esophageal reflux disease without esophagitis: Secondary | ICD-10-CM | POA: Diagnosis not present

## 2013-09-30 DIAGNOSIS — E119 Type 2 diabetes mellitus without complications: Secondary | ICD-10-CM | POA: Diagnosis not present

## 2013-09-30 DIAGNOSIS — I2510 Atherosclerotic heart disease of native coronary artery without angina pectoris: Secondary | ICD-10-CM | POA: Diagnosis not present

## 2013-09-30 DIAGNOSIS — F3289 Other specified depressive episodes: Secondary | ICD-10-CM | POA: Diagnosis not present

## 2013-09-30 DIAGNOSIS — F411 Generalized anxiety disorder: Secondary | ICD-10-CM | POA: Diagnosis not present

## 2013-09-30 DIAGNOSIS — Z8719 Personal history of other diseases of the digestive system: Secondary | ICD-10-CM | POA: Diagnosis not present

## 2013-09-30 DIAGNOSIS — F329 Major depressive disorder, single episode, unspecified: Secondary | ICD-10-CM | POA: Diagnosis not present

## 2013-09-30 DIAGNOSIS — K219 Gastro-esophageal reflux disease without esophagitis: Secondary | ICD-10-CM | POA: Diagnosis not present

## 2013-10-04 DIAGNOSIS — E538 Deficiency of other specified B group vitamins: Secondary | ICD-10-CM | POA: Diagnosis not present

## 2013-10-04 DIAGNOSIS — I10 Essential (primary) hypertension: Secondary | ICD-10-CM | POA: Diagnosis not present

## 2013-10-04 DIAGNOSIS — E559 Vitamin D deficiency, unspecified: Secondary | ICD-10-CM | POA: Diagnosis not present

## 2013-10-04 DIAGNOSIS — E785 Hyperlipidemia, unspecified: Secondary | ICD-10-CM | POA: Diagnosis not present

## 2013-10-04 DIAGNOSIS — I739 Peripheral vascular disease, unspecified: Secondary | ICD-10-CM | POA: Diagnosis not present

## 2013-10-04 DIAGNOSIS — I2510 Atherosclerotic heart disease of native coronary artery without angina pectoris: Secondary | ICD-10-CM | POA: Diagnosis not present

## 2013-10-04 DIAGNOSIS — E1159 Type 2 diabetes mellitus with other circulatory complications: Secondary | ICD-10-CM | POA: Diagnosis not present

## 2014-01-02 DIAGNOSIS — E538 Deficiency of other specified B group vitamins: Secondary | ICD-10-CM | POA: Diagnosis not present

## 2014-01-02 DIAGNOSIS — E1159 Type 2 diabetes mellitus with other circulatory complications: Secondary | ICD-10-CM | POA: Diagnosis not present

## 2014-01-02 DIAGNOSIS — E785 Hyperlipidemia, unspecified: Secondary | ICD-10-CM | POA: Diagnosis not present

## 2014-01-02 DIAGNOSIS — I2510 Atherosclerotic heart disease of native coronary artery without angina pectoris: Secondary | ICD-10-CM | POA: Diagnosis not present

## 2014-01-02 DIAGNOSIS — I739 Peripheral vascular disease, unspecified: Secondary | ICD-10-CM | POA: Diagnosis not present

## 2014-01-02 DIAGNOSIS — IMO0001 Reserved for inherently not codable concepts without codable children: Secondary | ICD-10-CM | POA: Diagnosis not present

## 2014-01-02 DIAGNOSIS — I10 Essential (primary) hypertension: Secondary | ICD-10-CM | POA: Diagnosis not present

## 2014-01-02 DIAGNOSIS — E559 Vitamin D deficiency, unspecified: Secondary | ICD-10-CM | POA: Diagnosis not present

## 2014-03-11 DIAGNOSIS — Z79899 Other long term (current) drug therapy: Secondary | ICD-10-CM | POA: Diagnosis not present

## 2014-03-11 DIAGNOSIS — M1610 Unilateral primary osteoarthritis, unspecified hip: Secondary | ICD-10-CM | POA: Diagnosis not present

## 2014-04-03 DIAGNOSIS — E1159 Type 2 diabetes mellitus with other circulatory complications: Secondary | ICD-10-CM | POA: Diagnosis not present

## 2014-04-03 DIAGNOSIS — I10 Essential (primary) hypertension: Secondary | ICD-10-CM | POA: Diagnosis not present

## 2014-04-03 DIAGNOSIS — I739 Peripheral vascular disease, unspecified: Secondary | ICD-10-CM | POA: Diagnosis not present

## 2014-04-03 DIAGNOSIS — I2510 Atherosclerotic heart disease of native coronary artery without angina pectoris: Secondary | ICD-10-CM | POA: Diagnosis not present

## 2014-04-03 DIAGNOSIS — E559 Vitamin D deficiency, unspecified: Secondary | ICD-10-CM | POA: Diagnosis not present

## 2014-04-03 DIAGNOSIS — E538 Deficiency of other specified B group vitamins: Secondary | ICD-10-CM | POA: Diagnosis not present

## 2014-04-17 DIAGNOSIS — J449 Chronic obstructive pulmonary disease, unspecified: Secondary | ICD-10-CM | POA: Insufficient documentation

## 2014-07-03 DIAGNOSIS — E559 Vitamin D deficiency, unspecified: Secondary | ICD-10-CM | POA: Diagnosis not present

## 2014-07-03 DIAGNOSIS — Z23 Encounter for immunization: Secondary | ICD-10-CM | POA: Diagnosis not present

## 2014-07-03 DIAGNOSIS — E785 Hyperlipidemia, unspecified: Secondary | ICD-10-CM | POA: Diagnosis not present

## 2014-07-03 DIAGNOSIS — E1140 Type 2 diabetes mellitus with diabetic neuropathy, unspecified: Secondary | ICD-10-CM | POA: Diagnosis not present

## 2014-07-03 DIAGNOSIS — I739 Peripheral vascular disease, unspecified: Secondary | ICD-10-CM | POA: Diagnosis not present

## 2014-07-03 DIAGNOSIS — I10 Essential (primary) hypertension: Secondary | ICD-10-CM | POA: Diagnosis not present

## 2014-07-03 DIAGNOSIS — E1159 Type 2 diabetes mellitus with other circulatory complications: Secondary | ICD-10-CM | POA: Diagnosis not present

## 2014-10-15 DIAGNOSIS — R05 Cough: Secondary | ICD-10-CM | POA: Diagnosis not present

## 2014-10-15 DIAGNOSIS — J069 Acute upper respiratory infection, unspecified: Secondary | ICD-10-CM | POA: Diagnosis not present

## 2014-11-10 DIAGNOSIS — E1159 Type 2 diabetes mellitus with other circulatory complications: Secondary | ICD-10-CM | POA: Diagnosis not present

## 2014-11-10 DIAGNOSIS — I739 Peripheral vascular disease, unspecified: Secondary | ICD-10-CM | POA: Diagnosis not present

## 2014-11-10 DIAGNOSIS — Z1389 Encounter for screening for other disorder: Secondary | ICD-10-CM | POA: Diagnosis not present

## 2014-11-10 DIAGNOSIS — E538 Deficiency of other specified B group vitamins: Secondary | ICD-10-CM | POA: Diagnosis not present

## 2014-11-10 DIAGNOSIS — K219 Gastro-esophageal reflux disease without esophagitis: Secondary | ICD-10-CM | POA: Diagnosis not present

## 2014-11-10 DIAGNOSIS — E559 Vitamin D deficiency, unspecified: Secondary | ICD-10-CM | POA: Diagnosis not present

## 2014-11-10 DIAGNOSIS — I10 Essential (primary) hypertension: Secondary | ICD-10-CM | POA: Diagnosis not present

## 2014-11-10 DIAGNOSIS — I2510 Atherosclerotic heart disease of native coronary artery without angina pectoris: Secondary | ICD-10-CM | POA: Diagnosis not present

## 2014-11-10 DIAGNOSIS — E785 Hyperlipidemia, unspecified: Secondary | ICD-10-CM | POA: Diagnosis not present

## 2014-11-10 DIAGNOSIS — Z9181 History of falling: Secondary | ICD-10-CM | POA: Diagnosis not present

## 2014-11-10 DIAGNOSIS — E1140 Type 2 diabetes mellitus with diabetic neuropathy, unspecified: Secondary | ICD-10-CM | POA: Diagnosis not present

## 2014-11-10 DIAGNOSIS — Z6827 Body mass index (BMI) 27.0-27.9, adult: Secondary | ICD-10-CM | POA: Diagnosis not present

## 2014-11-24 DIAGNOSIS — M542 Cervicalgia: Secondary | ICD-10-CM | POA: Diagnosis not present

## 2015-03-11 DIAGNOSIS — E785 Hyperlipidemia, unspecified: Secondary | ICD-10-CM | POA: Diagnosis not present

## 2015-03-11 DIAGNOSIS — I739 Peripheral vascular disease, unspecified: Secondary | ICD-10-CM | POA: Diagnosis not present

## 2015-03-11 DIAGNOSIS — E559 Vitamin D deficiency, unspecified: Secondary | ICD-10-CM | POA: Diagnosis not present

## 2015-03-11 DIAGNOSIS — E1140 Type 2 diabetes mellitus with diabetic neuropathy, unspecified: Secondary | ICD-10-CM | POA: Diagnosis not present

## 2015-03-11 DIAGNOSIS — K219 Gastro-esophageal reflux disease without esophagitis: Secondary | ICD-10-CM | POA: Diagnosis not present

## 2015-03-11 DIAGNOSIS — I10 Essential (primary) hypertension: Secondary | ICD-10-CM | POA: Diagnosis not present

## 2015-03-11 DIAGNOSIS — E538 Deficiency of other specified B group vitamins: Secondary | ICD-10-CM | POA: Diagnosis not present

## 2015-03-11 DIAGNOSIS — E1159 Type 2 diabetes mellitus with other circulatory complications: Secondary | ICD-10-CM | POA: Diagnosis not present

## 2015-03-11 DIAGNOSIS — Z79899 Other long term (current) drug therapy: Secondary | ICD-10-CM | POA: Diagnosis not present

## 2015-03-11 DIAGNOSIS — Z6827 Body mass index (BMI) 27.0-27.9, adult: Secondary | ICD-10-CM | POA: Diagnosis not present

## 2015-03-11 DIAGNOSIS — Z8669 Personal history of other diseases of the nervous system and sense organs: Secondary | ICD-10-CM | POA: Diagnosis not present

## 2015-03-11 DIAGNOSIS — I2510 Atherosclerotic heart disease of native coronary artery without angina pectoris: Secondary | ICD-10-CM | POA: Diagnosis not present

## 2015-04-18 DIAGNOSIS — G4730 Sleep apnea, unspecified: Secondary | ICD-10-CM | POA: Insufficient documentation

## 2015-07-14 DIAGNOSIS — I739 Peripheral vascular disease, unspecified: Secondary | ICD-10-CM | POA: Diagnosis not present

## 2015-07-14 DIAGNOSIS — Z6827 Body mass index (BMI) 27.0-27.9, adult: Secondary | ICD-10-CM | POA: Diagnosis not present

## 2015-07-14 DIAGNOSIS — E785 Hyperlipidemia, unspecified: Secondary | ICD-10-CM | POA: Diagnosis not present

## 2015-07-14 DIAGNOSIS — E1159 Type 2 diabetes mellitus with other circulatory complications: Secondary | ICD-10-CM | POA: Diagnosis not present

## 2015-07-14 DIAGNOSIS — E559 Vitamin D deficiency, unspecified: Secondary | ICD-10-CM | POA: Diagnosis not present

## 2015-07-14 DIAGNOSIS — K219 Gastro-esophageal reflux disease without esophagitis: Secondary | ICD-10-CM | POA: Diagnosis not present

## 2015-07-14 DIAGNOSIS — Z79899 Other long term (current) drug therapy: Secondary | ICD-10-CM | POA: Diagnosis not present

## 2015-07-14 DIAGNOSIS — I2510 Atherosclerotic heart disease of native coronary artery without angina pectoris: Secondary | ICD-10-CM | POA: Diagnosis not present

## 2015-07-14 DIAGNOSIS — Z125 Encounter for screening for malignant neoplasm of prostate: Secondary | ICD-10-CM | POA: Diagnosis not present

## 2015-07-14 DIAGNOSIS — I10 Essential (primary) hypertension: Secondary | ICD-10-CM | POA: Diagnosis not present

## 2015-07-14 DIAGNOSIS — E1140 Type 2 diabetes mellitus with diabetic neuropathy, unspecified: Secondary | ICD-10-CM | POA: Diagnosis not present

## 2015-07-14 DIAGNOSIS — Z23 Encounter for immunization: Secondary | ICD-10-CM | POA: Diagnosis not present

## 2015-07-27 ENCOUNTER — Encounter: Payer: Self-pay | Admitting: Podiatry

## 2015-07-27 ENCOUNTER — Ambulatory Visit (INDEPENDENT_AMBULATORY_CARE_PROVIDER_SITE_OTHER): Payer: Medicare Other | Admitting: Podiatry

## 2015-07-27 VITALS — BP 127/81 | HR 60 | Resp 14

## 2015-07-27 DIAGNOSIS — E1149 Type 2 diabetes mellitus with other diabetic neurological complication: Secondary | ICD-10-CM | POA: Diagnosis not present

## 2015-07-27 NOTE — Progress Notes (Signed)
   Subjective:    Patient ID: Michael Friedman, male    DOB: [birth year], 69 y.o.   MRN: [id number]  HPI This patient is a diabetic and he presents to my office for diabetic exam both feet.  He was referred by his doctor.  He says he has neuropathy for which he takes gabapentin.   Patient is here today for a diabetic feet exam.    Review of Systems  All other systems reviewed and are negative.      Objective:   Physical Exam GENERAL APPEARANCE: Alert, conversant. Appropriately groomed. No acute distress.  VASCULAR: Pedal pulses palpable at  [facility] bilateral.  Capillary refill time is immediate to all digits,  Normal temperature gradient.  Digital hair growth is present bilateral  NEUROLOGIC: sensation is normal to 5.07 monofilament at 5/5 sites bilateral.  Light touch is intact bilateral, Muscle strength normal. Vibrattory absent left foot and diminished right foot. MUSCULOSKELETAL: acceptable muscle strength, tone and stability bilateral.  Intrinsic muscluature intact bilateral.  Rectus appearance of foot and digits noted bilateral.   DERMATOLOGIC: skin color, texture, and turgor are within normal limits.  No preulcerative lesions or ulcers  are seen, no interdigital maceration noted.  No open lesions present.  Digital nails are asymptomatic. No drainage noted. Pinch callus hallux B/L.         Assessment & Plan:  Diabetic neuropathy.  IE  The only finding is decreased vibratory sensation both feet.  RTC 1 year

## 2015-08-27 DIAGNOSIS — E119 Type 2 diabetes mellitus without complications: Secondary | ICD-10-CM | POA: Diagnosis not present

## 2015-08-27 DIAGNOSIS — H2513 Age-related nuclear cataract, bilateral: Secondary | ICD-10-CM | POA: Diagnosis not present

## 2015-08-27 DIAGNOSIS — H524 Presbyopia: Secondary | ICD-10-CM | POA: Diagnosis not present

## 2015-11-16 DIAGNOSIS — Z79899 Other long term (current) drug therapy: Secondary | ICD-10-CM | POA: Diagnosis not present

## 2015-11-16 DIAGNOSIS — I10 Essential (primary) hypertension: Secondary | ICD-10-CM | POA: Diagnosis not present

## 2015-11-16 DIAGNOSIS — I2510 Atherosclerotic heart disease of native coronary artery without angina pectoris: Secondary | ICD-10-CM | POA: Diagnosis not present

## 2015-11-16 DIAGNOSIS — Z6827 Body mass index (BMI) 27.0-27.9, adult: Secondary | ICD-10-CM | POA: Diagnosis not present

## 2015-11-16 DIAGNOSIS — E785 Hyperlipidemia, unspecified: Secondary | ICD-10-CM | POA: Diagnosis not present

## 2015-11-16 DIAGNOSIS — E1140 Type 2 diabetes mellitus with diabetic neuropathy, unspecified: Secondary | ICD-10-CM | POA: Diagnosis not present

## 2015-11-16 DIAGNOSIS — E559 Vitamin D deficiency, unspecified: Secondary | ICD-10-CM | POA: Diagnosis not present

## 2016-03-16 DIAGNOSIS — R194 Change in bowel habit: Secondary | ICD-10-CM | POA: Diagnosis not present

## 2016-03-16 DIAGNOSIS — Z86010 Personal history of colonic polyps: Secondary | ICD-10-CM | POA: Diagnosis not present

## 2016-04-21 DIAGNOSIS — Z6827 Body mass index (BMI) 27.0-27.9, adult: Secondary | ICD-10-CM | POA: Diagnosis not present

## 2016-04-21 DIAGNOSIS — I2510 Atherosclerotic heart disease of native coronary artery without angina pectoris: Secondary | ICD-10-CM | POA: Diagnosis not present

## 2016-04-21 DIAGNOSIS — I10 Essential (primary) hypertension: Secondary | ICD-10-CM | POA: Diagnosis not present

## 2016-04-21 DIAGNOSIS — E559 Vitamin D deficiency, unspecified: Secondary | ICD-10-CM | POA: Diagnosis not present

## 2016-04-21 DIAGNOSIS — Z79899 Other long term (current) drug therapy: Secondary | ICD-10-CM | POA: Diagnosis not present

## 2016-04-21 DIAGNOSIS — E785 Hyperlipidemia, unspecified: Secondary | ICD-10-CM | POA: Diagnosis not present

## 2016-04-21 DIAGNOSIS — E1140 Type 2 diabetes mellitus with diabetic neuropathy, unspecified: Secondary | ICD-10-CM | POA: Diagnosis not present

## 2016-07-25 ENCOUNTER — Ambulatory Visit: Payer: Medicare Other | Admitting: Podiatry

## 2016-10-25 DIAGNOSIS — E559 Vitamin D deficiency, unspecified: Secondary | ICD-10-CM | POA: Diagnosis not present

## 2016-10-25 DIAGNOSIS — E1159 Type 2 diabetes mellitus with other circulatory complications: Secondary | ICD-10-CM | POA: Diagnosis not present

## 2016-10-25 DIAGNOSIS — K219 Gastro-esophageal reflux disease without esophagitis: Secondary | ICD-10-CM | POA: Diagnosis not present

## 2016-10-25 DIAGNOSIS — Z6830 Body mass index (BMI) 30.0-30.9, adult: Secondary | ICD-10-CM | POA: Diagnosis not present

## 2016-10-25 DIAGNOSIS — E1140 Type 2 diabetes mellitus with diabetic neuropathy, unspecified: Secondary | ICD-10-CM | POA: Diagnosis not present

## 2016-10-25 DIAGNOSIS — I10 Essential (primary) hypertension: Secondary | ICD-10-CM | POA: Diagnosis not present

## 2016-10-25 DIAGNOSIS — E785 Hyperlipidemia, unspecified: Secondary | ICD-10-CM | POA: Diagnosis not present

## 2016-10-25 DIAGNOSIS — I2510 Atherosclerotic heart disease of native coronary artery without angina pectoris: Secondary | ICD-10-CM | POA: Diagnosis not present

## 2016-10-25 DIAGNOSIS — Z79899 Other long term (current) drug therapy: Secondary | ICD-10-CM | POA: Diagnosis not present

## 2016-10-28 DIAGNOSIS — E669 Obesity, unspecified: Secondary | ICD-10-CM | POA: Diagnosis not present

## 2016-10-28 DIAGNOSIS — Z125 Encounter for screening for malignant neoplasm of prostate: Secondary | ICD-10-CM | POA: Diagnosis not present

## 2016-10-28 DIAGNOSIS — Z136 Encounter for screening for cardiovascular disorders: Secondary | ICD-10-CM | POA: Diagnosis not present

## 2016-10-28 DIAGNOSIS — Z9181 History of falling: Secondary | ICD-10-CM | POA: Diagnosis not present

## 2016-10-28 DIAGNOSIS — Z0000 Encounter for general adult medical examination without abnormal findings: Secondary | ICD-10-CM | POA: Diagnosis not present

## 2016-10-28 DIAGNOSIS — Z1389 Encounter for screening for other disorder: Secondary | ICD-10-CM | POA: Diagnosis not present

## 2017-04-24 DIAGNOSIS — E1140 Type 2 diabetes mellitus with diabetic neuropathy, unspecified: Secondary | ICD-10-CM | POA: Diagnosis not present

## 2017-04-24 DIAGNOSIS — K219 Gastro-esophageal reflux disease without esophagitis: Secondary | ICD-10-CM | POA: Diagnosis not present

## 2017-04-24 DIAGNOSIS — E559 Vitamin D deficiency, unspecified: Secondary | ICD-10-CM | POA: Diagnosis not present

## 2017-04-24 DIAGNOSIS — I10 Essential (primary) hypertension: Secondary | ICD-10-CM | POA: Diagnosis not present

## 2017-04-24 DIAGNOSIS — Z8669 Personal history of other diseases of the nervous system and sense organs: Secondary | ICD-10-CM | POA: Diagnosis not present

## 2017-04-24 DIAGNOSIS — Z125 Encounter for screening for malignant neoplasm of prostate: Secondary | ICD-10-CM | POA: Diagnosis not present

## 2017-04-24 DIAGNOSIS — I739 Peripheral vascular disease, unspecified: Secondary | ICD-10-CM | POA: Diagnosis not present

## 2017-04-24 DIAGNOSIS — E785 Hyperlipidemia, unspecified: Secondary | ICD-10-CM | POA: Diagnosis not present

## 2017-04-24 DIAGNOSIS — I2510 Atherosclerotic heart disease of native coronary artery without angina pectoris: Secondary | ICD-10-CM | POA: Diagnosis not present

## 2017-04-24 DIAGNOSIS — E1159 Type 2 diabetes mellitus with other circulatory complications: Secondary | ICD-10-CM | POA: Diagnosis not present

## 2017-04-24 DIAGNOSIS — Z79899 Other long term (current) drug therapy: Secondary | ICD-10-CM | POA: Diagnosis not present

## 2017-04-24 DIAGNOSIS — Z6831 Body mass index (BMI) 31.0-31.9, adult: Secondary | ICD-10-CM | POA: Diagnosis not present

## 2017-10-24 DIAGNOSIS — Z79899 Other long term (current) drug therapy: Secondary | ICD-10-CM | POA: Diagnosis not present

## 2017-10-24 DIAGNOSIS — E1140 Type 2 diabetes mellitus with diabetic neuropathy, unspecified: Secondary | ICD-10-CM | POA: Diagnosis not present

## 2017-10-24 DIAGNOSIS — K219 Gastro-esophageal reflux disease without esophagitis: Secondary | ICD-10-CM | POA: Diagnosis not present

## 2017-10-24 DIAGNOSIS — Z6831 Body mass index (BMI) 31.0-31.9, adult: Secondary | ICD-10-CM | POA: Diagnosis not present

## 2017-10-24 DIAGNOSIS — I10 Essential (primary) hypertension: Secondary | ICD-10-CM | POA: Diagnosis not present

## 2017-10-24 DIAGNOSIS — Z23 Encounter for immunization: Secondary | ICD-10-CM | POA: Diagnosis not present

## 2017-10-24 DIAGNOSIS — E785 Hyperlipidemia, unspecified: Secondary | ICD-10-CM | POA: Diagnosis not present

## 2017-10-24 DIAGNOSIS — Z8669 Personal history of other diseases of the nervous system and sense organs: Secondary | ICD-10-CM | POA: Diagnosis not present

## 2017-10-24 DIAGNOSIS — E1159 Type 2 diabetes mellitus with other circulatory complications: Secondary | ICD-10-CM | POA: Diagnosis not present

## 2017-10-24 DIAGNOSIS — I2510 Atherosclerotic heart disease of native coronary artery without angina pectoris: Secondary | ICD-10-CM | POA: Diagnosis not present

## 2017-10-24 DIAGNOSIS — I739 Peripheral vascular disease, unspecified: Secondary | ICD-10-CM | POA: Diagnosis not present

## 2017-10-24 DIAGNOSIS — E559 Vitamin D deficiency, unspecified: Secondary | ICD-10-CM | POA: Diagnosis not present

## 2017-11-14 DIAGNOSIS — Z0000 Encounter for general adult medical examination without abnormal findings: Secondary | ICD-10-CM | POA: Diagnosis not present

## 2017-11-14 DIAGNOSIS — E785 Hyperlipidemia, unspecified: Secondary | ICD-10-CM | POA: Diagnosis not present

## 2017-11-14 DIAGNOSIS — Z125 Encounter for screening for malignant neoplasm of prostate: Secondary | ICD-10-CM | POA: Diagnosis not present

## 2017-11-14 DIAGNOSIS — Z6832 Body mass index (BMI) 32.0-32.9, adult: Secondary | ICD-10-CM | POA: Diagnosis not present

## 2017-11-14 DIAGNOSIS — E669 Obesity, unspecified: Secondary | ICD-10-CM | POA: Diagnosis not present

## 2018-04-23 DIAGNOSIS — I10 Essential (primary) hypertension: Secondary | ICD-10-CM | POA: Diagnosis not present

## 2018-04-23 DIAGNOSIS — I2510 Atherosclerotic heart disease of native coronary artery without angina pectoris: Secondary | ICD-10-CM | POA: Diagnosis not present

## 2018-04-23 DIAGNOSIS — Z125 Encounter for screening for malignant neoplasm of prostate: Secondary | ICD-10-CM | POA: Diagnosis not present

## 2018-04-23 DIAGNOSIS — E559 Vitamin D deficiency, unspecified: Secondary | ICD-10-CM | POA: Diagnosis not present

## 2018-04-23 DIAGNOSIS — E785 Hyperlipidemia, unspecified: Secondary | ICD-10-CM | POA: Diagnosis not present

## 2018-04-23 DIAGNOSIS — E1140 Type 2 diabetes mellitus with diabetic neuropathy, unspecified: Secondary | ICD-10-CM | POA: Diagnosis not present

## 2018-04-23 DIAGNOSIS — E1159 Type 2 diabetes mellitus with other circulatory complications: Secondary | ICD-10-CM | POA: Diagnosis not present

## 2018-10-24 DIAGNOSIS — E785 Hyperlipidemia, unspecified: Secondary | ICD-10-CM | POA: Diagnosis not present

## 2018-10-24 DIAGNOSIS — I2510 Atherosclerotic heart disease of native coronary artery without angina pectoris: Secondary | ICD-10-CM | POA: Diagnosis not present

## 2018-10-24 DIAGNOSIS — Z125 Encounter for screening for malignant neoplasm of prostate: Secondary | ICD-10-CM | POA: Diagnosis not present

## 2018-10-24 DIAGNOSIS — E1140 Type 2 diabetes mellitus with diabetic neuropathy, unspecified: Secondary | ICD-10-CM | POA: Diagnosis not present

## 2018-10-24 DIAGNOSIS — E559 Vitamin D deficiency, unspecified: Secondary | ICD-10-CM | POA: Diagnosis not present

## 2018-10-24 DIAGNOSIS — E1159 Type 2 diabetes mellitus with other circulatory complications: Secondary | ICD-10-CM | POA: Diagnosis not present

## 2018-10-24 DIAGNOSIS — I10 Essential (primary) hypertension: Secondary | ICD-10-CM | POA: Diagnosis not present

## 2019-02-03 DIAGNOSIS — M5441 Lumbago with sciatica, right side: Secondary | ICD-10-CM | POA: Diagnosis not present

## 2019-02-03 DIAGNOSIS — R52 Pain, unspecified: Secondary | ICD-10-CM | POA: Diagnosis not present

## 2019-02-03 DIAGNOSIS — M5489 Other dorsalgia: Secondary | ICD-10-CM | POA: Diagnosis not present

## 2019-02-03 DIAGNOSIS — M5136 Other intervertebral disc degeneration, lumbar region: Secondary | ICD-10-CM | POA: Diagnosis not present

## 2019-02-03 DIAGNOSIS — M25551 Pain in right hip: Secondary | ICD-10-CM | POA: Diagnosis not present

## 2019-02-03 DIAGNOSIS — G8929 Other chronic pain: Secondary | ICD-10-CM | POA: Diagnosis not present

## 2019-02-05 DIAGNOSIS — R52 Pain, unspecified: Secondary | ICD-10-CM | POA: Diagnosis not present

## 2019-02-05 DIAGNOSIS — M25551 Pain in right hip: Secondary | ICD-10-CM | POA: Diagnosis not present

## 2019-02-05 DIAGNOSIS — M47816 Spondylosis without myelopathy or radiculopathy, lumbar region: Secondary | ICD-10-CM | POA: Diagnosis not present

## 2019-02-05 DIAGNOSIS — M549 Dorsalgia, unspecified: Secondary | ICD-10-CM | POA: Diagnosis not present

## 2019-02-05 DIAGNOSIS — M5126 Other intervertebral disc displacement, lumbar region: Secondary | ICD-10-CM | POA: Diagnosis not present

## 2019-02-05 DIAGNOSIS — M5489 Other dorsalgia: Secondary | ICD-10-CM | POA: Diagnosis not present

## 2019-02-05 DIAGNOSIS — R456 Violent behavior: Secondary | ICD-10-CM | POA: Diagnosis not present

## 2019-02-05 DIAGNOSIS — M48061 Spinal stenosis, lumbar region without neurogenic claudication: Secondary | ICD-10-CM | POA: Diagnosis not present

## 2019-02-25 DIAGNOSIS — I2510 Atherosclerotic heart disease of native coronary artery without angina pectoris: Secondary | ICD-10-CM | POA: Diagnosis not present

## 2019-02-25 DIAGNOSIS — E1140 Type 2 diabetes mellitus with diabetic neuropathy, unspecified: Secondary | ICD-10-CM | POA: Diagnosis not present

## 2019-02-25 DIAGNOSIS — M48061 Spinal stenosis, lumbar region without neurogenic claudication: Secondary | ICD-10-CM | POA: Diagnosis not present

## 2019-02-25 DIAGNOSIS — E785 Hyperlipidemia, unspecified: Secondary | ICD-10-CM | POA: Diagnosis not present

## 2019-02-25 DIAGNOSIS — I10 Essential (primary) hypertension: Secondary | ICD-10-CM | POA: Diagnosis not present

## 2019-02-28 DIAGNOSIS — M48062 Spinal stenosis, lumbar region with neurogenic claudication: Secondary | ICD-10-CM | POA: Diagnosis not present

## 2019-03-01 DIAGNOSIS — F411 Generalized anxiety disorder: Secondary | ICD-10-CM | POA: Diagnosis not present

## 2019-03-01 DIAGNOSIS — F410 Panic disorder [episodic paroxysmal anxiety] without agoraphobia: Secondary | ICD-10-CM | POA: Diagnosis not present

## 2019-03-01 DIAGNOSIS — Z6830 Body mass index (BMI) 30.0-30.9, adult: Secondary | ICD-10-CM | POA: Diagnosis not present

## 2019-03-07 DIAGNOSIS — M48061 Spinal stenosis, lumbar region without neurogenic claudication: Secondary | ICD-10-CM | POA: Diagnosis not present

## 2019-03-07 DIAGNOSIS — Z6829 Body mass index (BMI) 29.0-29.9, adult: Secondary | ICD-10-CM | POA: Diagnosis not present

## 2019-03-07 DIAGNOSIS — M5416 Radiculopathy, lumbar region: Secondary | ICD-10-CM | POA: Diagnosis not present

## 2019-03-27 DIAGNOSIS — Z79891 Long term (current) use of opiate analgesic: Secondary | ICD-10-CM | POA: Diagnosis not present

## 2019-03-27 DIAGNOSIS — Z1389 Encounter for screening for other disorder: Secondary | ICD-10-CM | POA: Diagnosis not present

## 2019-03-27 DIAGNOSIS — R202 Paresthesia of skin: Secondary | ICD-10-CM | POA: Diagnosis not present

## 2019-03-27 DIAGNOSIS — Z79899 Other long term (current) drug therapy: Secondary | ICD-10-CM | POA: Diagnosis not present

## 2019-03-27 DIAGNOSIS — M545 Low back pain: Secondary | ICD-10-CM | POA: Diagnosis not present

## 2019-03-27 DIAGNOSIS — M5416 Radiculopathy, lumbar region: Secondary | ICD-10-CM | POA: Diagnosis not present

## 2019-03-27 DIAGNOSIS — M47816 Spondylosis without myelopathy or radiculopathy, lumbar region: Secondary | ICD-10-CM | POA: Diagnosis not present

## 2019-03-27 DIAGNOSIS — F99 Mental disorder, not otherwise specified: Secondary | ICD-10-CM | POA: Diagnosis not present

## 2019-03-27 DIAGNOSIS — G894 Chronic pain syndrome: Secondary | ICD-10-CM | POA: Diagnosis not present

## 2019-04-01 DIAGNOSIS — J301 Allergic rhinitis due to pollen: Secondary | ICD-10-CM | POA: Diagnosis not present

## 2019-04-01 DIAGNOSIS — G4733 Obstructive sleep apnea (adult) (pediatric): Secondary | ICD-10-CM | POA: Diagnosis not present

## 2019-04-01 DIAGNOSIS — J4520 Mild intermittent asthma, uncomplicated: Secondary | ICD-10-CM | POA: Diagnosis not present

## 2019-04-01 DIAGNOSIS — R5383 Other fatigue: Secondary | ICD-10-CM | POA: Diagnosis not present

## 2019-04-02 DIAGNOSIS — F411 Generalized anxiety disorder: Secondary | ICD-10-CM | POA: Diagnosis not present

## 2019-04-02 DIAGNOSIS — Z139 Encounter for screening, unspecified: Secondary | ICD-10-CM | POA: Diagnosis not present

## 2019-04-02 DIAGNOSIS — M48061 Spinal stenosis, lumbar region without neurogenic claudication: Secondary | ICD-10-CM | POA: Diagnosis not present

## 2019-04-02 DIAGNOSIS — Z6830 Body mass index (BMI) 30.0-30.9, adult: Secondary | ICD-10-CM | POA: Diagnosis not present

## 2019-04-06 DIAGNOSIS — G4733 Obstructive sleep apnea (adult) (pediatric): Secondary | ICD-10-CM | POA: Diagnosis not present

## 2019-04-10 DIAGNOSIS — G4733 Obstructive sleep apnea (adult) (pediatric): Secondary | ICD-10-CM | POA: Diagnosis not present

## 2019-04-10 DIAGNOSIS — J4520 Mild intermittent asthma, uncomplicated: Secondary | ICD-10-CM | POA: Diagnosis not present

## 2019-04-10 DIAGNOSIS — J301 Allergic rhinitis due to pollen: Secondary | ICD-10-CM | POA: Diagnosis not present

## 2019-04-10 DIAGNOSIS — R5383 Other fatigue: Secondary | ICD-10-CM | POA: Diagnosis not present

## 2019-04-16 DIAGNOSIS — G4733 Obstructive sleep apnea (adult) (pediatric): Secondary | ICD-10-CM | POA: Diagnosis not present

## 2019-04-17 DIAGNOSIS — M5416 Radiculopathy, lumbar region: Secondary | ICD-10-CM | POA: Diagnosis not present

## 2019-04-17 DIAGNOSIS — R202 Paresthesia of skin: Secondary | ICD-10-CM | POA: Diagnosis not present

## 2019-04-17 DIAGNOSIS — M545 Low back pain: Secondary | ICD-10-CM | POA: Diagnosis not present

## 2019-04-17 DIAGNOSIS — Z79891 Long term (current) use of opiate analgesic: Secondary | ICD-10-CM | POA: Diagnosis not present

## 2019-04-17 DIAGNOSIS — Z79899 Other long term (current) drug therapy: Secondary | ICD-10-CM | POA: Diagnosis not present

## 2019-04-17 DIAGNOSIS — G894 Chronic pain syndrome: Secondary | ICD-10-CM | POA: Diagnosis not present

## 2019-04-17 DIAGNOSIS — F99 Mental disorder, not otherwise specified: Secondary | ICD-10-CM | POA: Diagnosis not present

## 2019-04-17 DIAGNOSIS — M47816 Spondylosis without myelopathy or radiculopathy, lumbar region: Secondary | ICD-10-CM | POA: Diagnosis not present

## 2019-04-17 DIAGNOSIS — Z1389 Encounter for screening for other disorder: Secondary | ICD-10-CM | POA: Diagnosis not present

## 2019-04-22 DIAGNOSIS — J301 Allergic rhinitis due to pollen: Secondary | ICD-10-CM | POA: Diagnosis not present

## 2019-04-22 DIAGNOSIS — J4520 Mild intermittent asthma, uncomplicated: Secondary | ICD-10-CM | POA: Diagnosis not present

## 2019-04-22 DIAGNOSIS — R5383 Other fatigue: Secondary | ICD-10-CM | POA: Diagnosis not present

## 2019-04-22 DIAGNOSIS — G4733 Obstructive sleep apnea (adult) (pediatric): Secondary | ICD-10-CM | POA: Diagnosis not present

## 2019-04-24 DIAGNOSIS — Z9181 History of falling: Secondary | ICD-10-CM | POA: Diagnosis not present

## 2019-04-24 DIAGNOSIS — Z125 Encounter for screening for malignant neoplasm of prostate: Secondary | ICD-10-CM | POA: Diagnosis not present

## 2019-04-24 DIAGNOSIS — Z1331 Encounter for screening for depression: Secondary | ICD-10-CM | POA: Diagnosis not present

## 2019-04-24 DIAGNOSIS — E669 Obesity, unspecified: Secondary | ICD-10-CM | POA: Diagnosis not present

## 2019-04-24 DIAGNOSIS — Z0000 Encounter for general adult medical examination without abnormal findings: Secondary | ICD-10-CM | POA: Diagnosis not present

## 2019-04-24 DIAGNOSIS — E785 Hyperlipidemia, unspecified: Secondary | ICD-10-CM | POA: Diagnosis not present

## 2019-05-08 DIAGNOSIS — G629 Polyneuropathy, unspecified: Secondary | ICD-10-CM | POA: Diagnosis not present

## 2019-05-13 DIAGNOSIS — G4733 Obstructive sleep apnea (adult) (pediatric): Secondary | ICD-10-CM | POA: Diagnosis not present

## 2019-05-15 DIAGNOSIS — M5416 Radiculopathy, lumbar region: Secondary | ICD-10-CM | POA: Diagnosis not present

## 2019-05-15 DIAGNOSIS — G894 Chronic pain syndrome: Secondary | ICD-10-CM | POA: Diagnosis not present

## 2019-05-15 DIAGNOSIS — Z1389 Encounter for screening for other disorder: Secondary | ICD-10-CM | POA: Diagnosis not present

## 2019-05-15 DIAGNOSIS — M47816 Spondylosis without myelopathy or radiculopathy, lumbar region: Secondary | ICD-10-CM | POA: Diagnosis not present

## 2019-05-15 DIAGNOSIS — R202 Paresthesia of skin: Secondary | ICD-10-CM | POA: Diagnosis not present

## 2019-05-15 DIAGNOSIS — M545 Low back pain: Secondary | ICD-10-CM | POA: Diagnosis not present

## 2019-05-15 DIAGNOSIS — Z79891 Long term (current) use of opiate analgesic: Secondary | ICD-10-CM | POA: Diagnosis not present

## 2019-05-15 DIAGNOSIS — Z79899 Other long term (current) drug therapy: Secondary | ICD-10-CM | POA: Diagnosis not present

## 2019-05-15 DIAGNOSIS — F99 Mental disorder, not otherwise specified: Secondary | ICD-10-CM | POA: Diagnosis not present

## 2019-06-12 DIAGNOSIS — F99 Mental disorder, not otherwise specified: Secondary | ICD-10-CM | POA: Diagnosis not present

## 2019-06-12 DIAGNOSIS — M5416 Radiculopathy, lumbar region: Secondary | ICD-10-CM | POA: Diagnosis not present

## 2019-06-12 DIAGNOSIS — M47816 Spondylosis without myelopathy or radiculopathy, lumbar region: Secondary | ICD-10-CM | POA: Diagnosis not present

## 2019-06-12 DIAGNOSIS — Z1389 Encounter for screening for other disorder: Secondary | ICD-10-CM | POA: Diagnosis not present

## 2019-06-12 DIAGNOSIS — G894 Chronic pain syndrome: Secondary | ICD-10-CM | POA: Diagnosis not present

## 2019-06-12 DIAGNOSIS — R202 Paresthesia of skin: Secondary | ICD-10-CM | POA: Diagnosis not present

## 2019-06-12 DIAGNOSIS — M545 Low back pain: Secondary | ICD-10-CM | POA: Diagnosis not present

## 2019-06-18 DIAGNOSIS — J4520 Mild intermittent asthma, uncomplicated: Secondary | ICD-10-CM | POA: Diagnosis not present

## 2019-06-18 DIAGNOSIS — G4733 Obstructive sleep apnea (adult) (pediatric): Secondary | ICD-10-CM | POA: Diagnosis not present

## 2019-06-18 DIAGNOSIS — R5383 Other fatigue: Secondary | ICD-10-CM | POA: Diagnosis not present

## 2019-06-18 DIAGNOSIS — Z23 Encounter for immunization: Secondary | ICD-10-CM | POA: Diagnosis not present

## 2019-06-18 DIAGNOSIS — J301 Allergic rhinitis due to pollen: Secondary | ICD-10-CM | POA: Diagnosis not present

## 2019-06-26 DIAGNOSIS — Z79899 Other long term (current) drug therapy: Secondary | ICD-10-CM | POA: Diagnosis not present

## 2019-06-26 DIAGNOSIS — F411 Generalized anxiety disorder: Secondary | ICD-10-CM | POA: Diagnosis not present

## 2019-06-26 DIAGNOSIS — M48061 Spinal stenosis, lumbar region without neurogenic claudication: Secondary | ICD-10-CM | POA: Diagnosis not present

## 2019-06-26 DIAGNOSIS — E785 Hyperlipidemia, unspecified: Secondary | ICD-10-CM | POA: Diagnosis not present

## 2019-06-26 DIAGNOSIS — E1140 Type 2 diabetes mellitus with diabetic neuropathy, unspecified: Secondary | ICD-10-CM | POA: Diagnosis not present

## 2019-07-10 DIAGNOSIS — F99 Mental disorder, not otherwise specified: Secondary | ICD-10-CM | POA: Diagnosis not present

## 2019-07-10 DIAGNOSIS — G894 Chronic pain syndrome: Secondary | ICD-10-CM | POA: Diagnosis not present

## 2019-07-10 DIAGNOSIS — M545 Low back pain: Secondary | ICD-10-CM | POA: Diagnosis not present

## 2019-07-10 DIAGNOSIS — M47816 Spondylosis without myelopathy or radiculopathy, lumbar region: Secondary | ICD-10-CM | POA: Diagnosis not present

## 2019-07-10 DIAGNOSIS — Z1389 Encounter for screening for other disorder: Secondary | ICD-10-CM | POA: Diagnosis not present

## 2019-07-10 DIAGNOSIS — M5416 Radiculopathy, lumbar region: Secondary | ICD-10-CM | POA: Diagnosis not present

## 2019-07-10 DIAGNOSIS — R202 Paresthesia of skin: Secondary | ICD-10-CM | POA: Diagnosis not present

## 2019-08-05 DIAGNOSIS — Z79891 Long term (current) use of opiate analgesic: Secondary | ICD-10-CM | POA: Diagnosis not present

## 2019-08-05 DIAGNOSIS — Z792 Long term (current) use of antibiotics: Secondary | ICD-10-CM | POA: Diagnosis not present

## 2019-08-05 DIAGNOSIS — F329 Major depressive disorder, single episode, unspecified: Secondary | ICD-10-CM | POA: Diagnosis not present

## 2019-08-05 DIAGNOSIS — I2510 Atherosclerotic heart disease of native coronary artery without angina pectoris: Secondary | ICD-10-CM | POA: Diagnosis not present

## 2019-08-05 DIAGNOSIS — G8929 Other chronic pain: Secondary | ICD-10-CM | POA: Diagnosis not present

## 2019-08-05 DIAGNOSIS — R45851 Suicidal ideations: Secondary | ICD-10-CM | POA: Diagnosis not present

## 2019-08-05 DIAGNOSIS — Z9181 History of falling: Secondary | ICD-10-CM | POA: Diagnosis not present

## 2019-08-05 DIAGNOSIS — Z751 Person awaiting admission to adequate facility elsewhere: Secondary | ICD-10-CM | POA: Diagnosis not present

## 2019-08-05 DIAGNOSIS — K219 Gastro-esophageal reflux disease without esophagitis: Secondary | ICD-10-CM | POA: Diagnosis not present

## 2019-08-05 DIAGNOSIS — I252 Old myocardial infarction: Secondary | ICD-10-CM | POA: Diagnosis not present

## 2019-08-05 DIAGNOSIS — Z7982 Long term (current) use of aspirin: Secondary | ICD-10-CM | POA: Diagnosis not present

## 2019-08-05 DIAGNOSIS — R52 Pain, unspecified: Secondary | ICD-10-CM | POA: Diagnosis not present

## 2019-08-05 DIAGNOSIS — F29 Unspecified psychosis not due to a substance or known physiological condition: Secondary | ICD-10-CM | POA: Diagnosis not present

## 2019-08-05 DIAGNOSIS — E7800 Pure hypercholesterolemia, unspecified: Secondary | ICD-10-CM | POA: Diagnosis not present

## 2019-08-05 DIAGNOSIS — M549 Dorsalgia, unspecified: Secondary | ICD-10-CM | POA: Diagnosis not present

## 2019-08-05 DIAGNOSIS — Z79899 Other long term (current) drug therapy: Secondary | ICD-10-CM | POA: Diagnosis not present

## 2019-08-05 DIAGNOSIS — Z7952 Long term (current) use of systemic steroids: Secondary | ICD-10-CM | POA: Diagnosis not present

## 2019-08-05 DIAGNOSIS — I10 Essential (primary) hypertension: Secondary | ICD-10-CM | POA: Diagnosis not present

## 2019-08-05 DIAGNOSIS — F332 Major depressive disorder, recurrent severe without psychotic features: Secondary | ICD-10-CM | POA: Diagnosis not present

## 2019-08-05 DIAGNOSIS — M5489 Other dorsalgia: Secondary | ICD-10-CM | POA: Diagnosis not present

## 2019-08-09 DIAGNOSIS — F419 Anxiety disorder, unspecified: Secondary | ICD-10-CM | POA: Diagnosis not present

## 2019-08-09 DIAGNOSIS — G8929 Other chronic pain: Secondary | ICD-10-CM | POA: Diagnosis not present

## 2019-08-09 DIAGNOSIS — R45851 Suicidal ideations: Secondary | ICD-10-CM | POA: Diagnosis not present

## 2019-08-09 DIAGNOSIS — E7800 Pure hypercholesterolemia, unspecified: Secondary | ICD-10-CM | POA: Diagnosis not present

## 2019-08-09 DIAGNOSIS — Z794 Long term (current) use of insulin: Secondary | ICD-10-CM | POA: Diagnosis not present

## 2019-08-09 DIAGNOSIS — E119 Type 2 diabetes mellitus without complications: Secondary | ICD-10-CM | POA: Diagnosis not present

## 2019-08-09 DIAGNOSIS — I2510 Atherosclerotic heart disease of native coronary artery without angina pectoris: Secondary | ICD-10-CM | POA: Diagnosis not present

## 2019-08-09 DIAGNOSIS — Z7902 Long term (current) use of antithrombotics/antiplatelets: Secondary | ICD-10-CM | POA: Diagnosis not present

## 2019-08-09 DIAGNOSIS — I252 Old myocardial infarction: Secondary | ICD-10-CM | POA: Diagnosis not present

## 2019-08-09 DIAGNOSIS — K219 Gastro-esophageal reflux disease without esophagitis: Secondary | ICD-10-CM | POA: Diagnosis not present

## 2019-08-09 DIAGNOSIS — F329 Major depressive disorder, single episode, unspecified: Secondary | ICD-10-CM | POA: Diagnosis not present

## 2019-08-09 DIAGNOSIS — M5440 Lumbago with sciatica, unspecified side: Secondary | ICD-10-CM | POA: Diagnosis not present

## 2019-08-09 DIAGNOSIS — Z7982 Long term (current) use of aspirin: Secondary | ICD-10-CM | POA: Diagnosis not present

## 2019-08-13 DIAGNOSIS — F411 Generalized anxiety disorder: Secondary | ICD-10-CM | POA: Diagnosis not present

## 2019-08-13 DIAGNOSIS — M48061 Spinal stenosis, lumbar region without neurogenic claudication: Secondary | ICD-10-CM | POA: Diagnosis not present

## 2019-08-13 DIAGNOSIS — Z6830 Body mass index (BMI) 30.0-30.9, adult: Secondary | ICD-10-CM | POA: Diagnosis not present

## 2019-08-13 DIAGNOSIS — Z79899 Other long term (current) drug therapy: Secondary | ICD-10-CM | POA: Diagnosis not present

## 2019-08-14 DIAGNOSIS — M47816 Spondylosis without myelopathy or radiculopathy, lumbar region: Secondary | ICD-10-CM | POA: Diagnosis not present

## 2019-08-14 DIAGNOSIS — Z1389 Encounter for screening for other disorder: Secondary | ICD-10-CM | POA: Diagnosis not present

## 2019-08-14 DIAGNOSIS — F99 Mental disorder, not otherwise specified: Secondary | ICD-10-CM | POA: Diagnosis not present

## 2019-08-14 DIAGNOSIS — M545 Low back pain: Secondary | ICD-10-CM | POA: Diagnosis not present

## 2019-08-14 DIAGNOSIS — G894 Chronic pain syndrome: Secondary | ICD-10-CM | POA: Diagnosis not present

## 2019-08-14 DIAGNOSIS — M5416 Radiculopathy, lumbar region: Secondary | ICD-10-CM | POA: Diagnosis not present

## 2019-08-14 DIAGNOSIS — R202 Paresthesia of skin: Secondary | ICD-10-CM | POA: Diagnosis not present

## 2019-09-08 DIAGNOSIS — I252 Old myocardial infarction: Secondary | ICD-10-CM | POA: Diagnosis not present

## 2019-09-08 DIAGNOSIS — Z7982 Long term (current) use of aspirin: Secondary | ICD-10-CM | POA: Diagnosis not present

## 2019-09-08 DIAGNOSIS — I2510 Atherosclerotic heart disease of native coronary artery without angina pectoris: Secondary | ICD-10-CM | POA: Diagnosis not present

## 2019-09-08 DIAGNOSIS — E7800 Pure hypercholesterolemia, unspecified: Secondary | ICD-10-CM | POA: Diagnosis not present

## 2019-09-08 DIAGNOSIS — K219 Gastro-esophageal reflux disease without esophagitis: Secondary | ICD-10-CM | POA: Diagnosis not present

## 2019-09-08 DIAGNOSIS — G8929 Other chronic pain: Secondary | ICD-10-CM | POA: Diagnosis not present

## 2019-09-08 DIAGNOSIS — Z7902 Long term (current) use of antithrombotics/antiplatelets: Secondary | ICD-10-CM | POA: Diagnosis not present

## 2019-09-08 DIAGNOSIS — F419 Anxiety disorder, unspecified: Secondary | ICD-10-CM | POA: Diagnosis not present

## 2019-09-08 DIAGNOSIS — Z794 Long term (current) use of insulin: Secondary | ICD-10-CM | POA: Diagnosis not present

## 2019-09-08 DIAGNOSIS — E119 Type 2 diabetes mellitus without complications: Secondary | ICD-10-CM | POA: Diagnosis not present

## 2019-09-08 DIAGNOSIS — R45851 Suicidal ideations: Secondary | ICD-10-CM | POA: Diagnosis not present

## 2019-09-08 DIAGNOSIS — M5440 Lumbago with sciatica, unspecified side: Secondary | ICD-10-CM | POA: Diagnosis not present

## 2019-09-08 DIAGNOSIS — F329 Major depressive disorder, single episode, unspecified: Secondary | ICD-10-CM | POA: Diagnosis not present

## 2019-09-09 DIAGNOSIS — F329 Major depressive disorder, single episode, unspecified: Secondary | ICD-10-CM | POA: Diagnosis not present

## 2019-09-09 DIAGNOSIS — M5440 Lumbago with sciatica, unspecified side: Secondary | ICD-10-CM | POA: Diagnosis not present

## 2019-09-09 DIAGNOSIS — Z7902 Long term (current) use of antithrombotics/antiplatelets: Secondary | ICD-10-CM | POA: Diagnosis not present

## 2019-09-09 DIAGNOSIS — Z7982 Long term (current) use of aspirin: Secondary | ICD-10-CM | POA: Diagnosis not present

## 2019-09-09 DIAGNOSIS — R45851 Suicidal ideations: Secondary | ICD-10-CM | POA: Diagnosis not present

## 2019-09-09 DIAGNOSIS — Z794 Long term (current) use of insulin: Secondary | ICD-10-CM | POA: Diagnosis not present

## 2019-09-18 DIAGNOSIS — M5440 Lumbago with sciatica, unspecified side: Secondary | ICD-10-CM | POA: Diagnosis not present

## 2019-09-18 DIAGNOSIS — Z794 Long term (current) use of insulin: Secondary | ICD-10-CM | POA: Diagnosis not present

## 2019-09-18 DIAGNOSIS — Z7982 Long term (current) use of aspirin: Secondary | ICD-10-CM | POA: Diagnosis not present

## 2019-09-18 DIAGNOSIS — R45851 Suicidal ideations: Secondary | ICD-10-CM | POA: Diagnosis not present

## 2019-09-18 DIAGNOSIS — Z7902 Long term (current) use of antithrombotics/antiplatelets: Secondary | ICD-10-CM | POA: Diagnosis not present

## 2019-09-18 DIAGNOSIS — F329 Major depressive disorder, single episode, unspecified: Secondary | ICD-10-CM | POA: Diagnosis not present

## 2019-09-23 DIAGNOSIS — Z7982 Long term (current) use of aspirin: Secondary | ICD-10-CM | POA: Diagnosis not present

## 2019-09-23 DIAGNOSIS — Z7902 Long term (current) use of antithrombotics/antiplatelets: Secondary | ICD-10-CM | POA: Diagnosis not present

## 2019-09-23 DIAGNOSIS — M5440 Lumbago with sciatica, unspecified side: Secondary | ICD-10-CM | POA: Diagnosis not present

## 2019-09-23 DIAGNOSIS — F329 Major depressive disorder, single episode, unspecified: Secondary | ICD-10-CM | POA: Diagnosis not present

## 2019-09-23 DIAGNOSIS — Z794 Long term (current) use of insulin: Secondary | ICD-10-CM | POA: Diagnosis not present

## 2019-09-23 DIAGNOSIS — R45851 Suicidal ideations: Secondary | ICD-10-CM | POA: Diagnosis not present

## 2019-09-24 DIAGNOSIS — M5416 Radiculopathy, lumbar region: Secondary | ICD-10-CM | POA: Diagnosis not present

## 2019-09-24 DIAGNOSIS — R202 Paresthesia of skin: Secondary | ICD-10-CM | POA: Diagnosis not present

## 2019-09-24 DIAGNOSIS — G894 Chronic pain syndrome: Secondary | ICD-10-CM | POA: Diagnosis not present

## 2019-09-24 DIAGNOSIS — Z1389 Encounter for screening for other disorder: Secondary | ICD-10-CM | POA: Diagnosis not present

## 2019-09-24 DIAGNOSIS — F99 Mental disorder, not otherwise specified: Secondary | ICD-10-CM | POA: Diagnosis not present

## 2019-09-24 DIAGNOSIS — M47816 Spondylosis without myelopathy or radiculopathy, lumbar region: Secondary | ICD-10-CM | POA: Diagnosis not present

## 2019-09-24 DIAGNOSIS — M545 Low back pain: Secondary | ICD-10-CM | POA: Diagnosis not present

## 2019-10-01 DIAGNOSIS — M5440 Lumbago with sciatica, unspecified side: Secondary | ICD-10-CM | POA: Diagnosis not present

## 2019-10-01 DIAGNOSIS — F329 Major depressive disorder, single episode, unspecified: Secondary | ICD-10-CM | POA: Diagnosis not present

## 2019-10-01 DIAGNOSIS — Z7982 Long term (current) use of aspirin: Secondary | ICD-10-CM | POA: Diagnosis not present

## 2019-10-01 DIAGNOSIS — Z7902 Long term (current) use of antithrombotics/antiplatelets: Secondary | ICD-10-CM | POA: Diagnosis not present

## 2019-10-01 DIAGNOSIS — R45851 Suicidal ideations: Secondary | ICD-10-CM | POA: Diagnosis not present

## 2019-10-01 DIAGNOSIS — Z794 Long term (current) use of insulin: Secondary | ICD-10-CM | POA: Diagnosis not present

## 2019-10-03 DIAGNOSIS — F321 Major depressive disorder, single episode, moderate: Secondary | ICD-10-CM | POA: Diagnosis not present

## 2019-10-03 DIAGNOSIS — F1011 Alcohol abuse, in remission: Secondary | ICD-10-CM | POA: Diagnosis not present

## 2019-10-17 DIAGNOSIS — F1011 Alcohol abuse, in remission: Secondary | ICD-10-CM | POA: Diagnosis not present

## 2019-10-17 DIAGNOSIS — F321 Major depressive disorder, single episode, moderate: Secondary | ICD-10-CM | POA: Diagnosis not present

## 2019-10-22 DIAGNOSIS — M545 Low back pain: Secondary | ICD-10-CM | POA: Diagnosis not present

## 2019-10-22 DIAGNOSIS — M47816 Spondylosis without myelopathy or radiculopathy, lumbar region: Secondary | ICD-10-CM | POA: Diagnosis not present

## 2019-10-22 DIAGNOSIS — M5416 Radiculopathy, lumbar region: Secondary | ICD-10-CM | POA: Diagnosis not present

## 2019-10-22 DIAGNOSIS — R202 Paresthesia of skin: Secondary | ICD-10-CM | POA: Diagnosis not present

## 2019-10-22 DIAGNOSIS — G894 Chronic pain syndrome: Secondary | ICD-10-CM | POA: Diagnosis not present

## 2019-10-22 DIAGNOSIS — Z1389 Encounter for screening for other disorder: Secondary | ICD-10-CM | POA: Diagnosis not present

## 2019-10-22 DIAGNOSIS — F99 Mental disorder, not otherwise specified: Secondary | ICD-10-CM | POA: Diagnosis not present

## 2019-10-28 DIAGNOSIS — F411 Generalized anxiety disorder: Secondary | ICD-10-CM | POA: Diagnosis not present

## 2019-10-28 DIAGNOSIS — E785 Hyperlipidemia, unspecified: Secondary | ICD-10-CM | POA: Diagnosis not present

## 2019-10-28 DIAGNOSIS — I2510 Atherosclerotic heart disease of native coronary artery without angina pectoris: Secondary | ICD-10-CM | POA: Diagnosis not present

## 2019-10-28 DIAGNOSIS — I10 Essential (primary) hypertension: Secondary | ICD-10-CM | POA: Diagnosis not present

## 2019-10-28 DIAGNOSIS — E1140 Type 2 diabetes mellitus with diabetic neuropathy, unspecified: Secondary | ICD-10-CM | POA: Diagnosis not present

## 2019-10-31 DIAGNOSIS — F321 Major depressive disorder, single episode, moderate: Secondary | ICD-10-CM | POA: Diagnosis not present

## 2019-10-31 DIAGNOSIS — F1011 Alcohol abuse, in remission: Secondary | ICD-10-CM | POA: Diagnosis not present

## 2019-11-11 DIAGNOSIS — Z79899 Other long term (current) drug therapy: Secondary | ICD-10-CM | POA: Diagnosis not present

## 2019-11-19 DIAGNOSIS — M545 Low back pain: Secondary | ICD-10-CM | POA: Diagnosis not present

## 2019-11-19 DIAGNOSIS — R202 Paresthesia of skin: Secondary | ICD-10-CM | POA: Diagnosis not present

## 2019-11-19 DIAGNOSIS — F99 Mental disorder, not otherwise specified: Secondary | ICD-10-CM | POA: Diagnosis not present

## 2019-11-19 DIAGNOSIS — M47816 Spondylosis without myelopathy or radiculopathy, lumbar region: Secondary | ICD-10-CM | POA: Diagnosis not present

## 2019-11-19 DIAGNOSIS — Z23 Encounter for immunization: Secondary | ICD-10-CM | POA: Diagnosis not present

## 2019-11-19 DIAGNOSIS — Z1389 Encounter for screening for other disorder: Secondary | ICD-10-CM | POA: Diagnosis not present

## 2019-11-19 DIAGNOSIS — M5416 Radiculopathy, lumbar region: Secondary | ICD-10-CM | POA: Diagnosis not present

## 2019-11-19 DIAGNOSIS — G894 Chronic pain syndrome: Secondary | ICD-10-CM | POA: Diagnosis not present

## 2019-11-20 DIAGNOSIS — F321 Major depressive disorder, single episode, moderate: Secondary | ICD-10-CM | POA: Diagnosis not present

## 2019-11-20 DIAGNOSIS — F1011 Alcohol abuse, in remission: Secondary | ICD-10-CM | POA: Diagnosis not present

## 2019-12-03 DIAGNOSIS — R5383 Other fatigue: Secondary | ICD-10-CM | POA: Diagnosis not present

## 2019-12-03 DIAGNOSIS — T7840XA Allergy, unspecified, initial encounter: Secondary | ICD-10-CM | POA: Diagnosis not present

## 2019-12-03 DIAGNOSIS — J301 Allergic rhinitis due to pollen: Secondary | ICD-10-CM | POA: Diagnosis not present

## 2019-12-03 DIAGNOSIS — J4520 Mild intermittent asthma, uncomplicated: Secondary | ICD-10-CM | POA: Diagnosis not present

## 2019-12-03 DIAGNOSIS — G4733 Obstructive sleep apnea (adult) (pediatric): Secondary | ICD-10-CM | POA: Diagnosis not present

## 2019-12-18 DIAGNOSIS — G894 Chronic pain syndrome: Secondary | ICD-10-CM | POA: Diagnosis not present

## 2019-12-18 DIAGNOSIS — M47816 Spondylosis without myelopathy or radiculopathy, lumbar region: Secondary | ICD-10-CM | POA: Diagnosis not present

## 2019-12-18 DIAGNOSIS — M5416 Radiculopathy, lumbar region: Secondary | ICD-10-CM | POA: Diagnosis not present

## 2019-12-18 DIAGNOSIS — Z1389 Encounter for screening for other disorder: Secondary | ICD-10-CM | POA: Diagnosis not present

## 2019-12-18 DIAGNOSIS — R202 Paresthesia of skin: Secondary | ICD-10-CM | POA: Diagnosis not present

## 2019-12-18 DIAGNOSIS — M545 Low back pain: Secondary | ICD-10-CM | POA: Diagnosis not present

## 2019-12-18 DIAGNOSIS — F99 Mental disorder, not otherwise specified: Secondary | ICD-10-CM | POA: Diagnosis not present

## 2019-12-27 DIAGNOSIS — G4733 Obstructive sleep apnea (adult) (pediatric): Secondary | ICD-10-CM | POA: Diagnosis not present

## 2020-01-15 DIAGNOSIS — M545 Low back pain: Secondary | ICD-10-CM | POA: Diagnosis not present

## 2020-01-15 DIAGNOSIS — F99 Mental disorder, not otherwise specified: Secondary | ICD-10-CM | POA: Diagnosis not present

## 2020-01-15 DIAGNOSIS — M47816 Spondylosis without myelopathy or radiculopathy, lumbar region: Secondary | ICD-10-CM | POA: Diagnosis not present

## 2020-01-15 DIAGNOSIS — G894 Chronic pain syndrome: Secondary | ICD-10-CM | POA: Diagnosis not present

## 2020-01-15 DIAGNOSIS — Z1389 Encounter for screening for other disorder: Secondary | ICD-10-CM | POA: Diagnosis not present

## 2020-01-15 DIAGNOSIS — M5416 Radiculopathy, lumbar region: Secondary | ICD-10-CM | POA: Diagnosis not present

## 2020-01-15 DIAGNOSIS — R202 Paresthesia of skin: Secondary | ICD-10-CM | POA: Diagnosis not present

## 2020-01-20 DIAGNOSIS — F321 Major depressive disorder, single episode, moderate: Secondary | ICD-10-CM | POA: Diagnosis not present

## 2020-01-20 DIAGNOSIS — F1011 Alcohol abuse, in remission: Secondary | ICD-10-CM | POA: Diagnosis not present

## 2020-01-28 DIAGNOSIS — J301 Allergic rhinitis due to pollen: Secondary | ICD-10-CM | POA: Diagnosis not present

## 2020-01-28 DIAGNOSIS — J4520 Mild intermittent asthma, uncomplicated: Secondary | ICD-10-CM | POA: Diagnosis not present

## 2020-01-28 DIAGNOSIS — R5383 Other fatigue: Secondary | ICD-10-CM | POA: Diagnosis not present

## 2020-01-28 DIAGNOSIS — G4733 Obstructive sleep apnea (adult) (pediatric): Secondary | ICD-10-CM | POA: Diagnosis not present

## 2020-02-12 DIAGNOSIS — M545 Low back pain: Secondary | ICD-10-CM | POA: Diagnosis not present

## 2020-02-12 DIAGNOSIS — M5416 Radiculopathy, lumbar region: Secondary | ICD-10-CM | POA: Diagnosis not present

## 2020-02-12 DIAGNOSIS — F99 Mental disorder, not otherwise specified: Secondary | ICD-10-CM | POA: Diagnosis not present

## 2020-02-12 DIAGNOSIS — R202 Paresthesia of skin: Secondary | ICD-10-CM | POA: Diagnosis not present

## 2020-02-12 DIAGNOSIS — Z79891 Long term (current) use of opiate analgesic: Secondary | ICD-10-CM | POA: Diagnosis not present

## 2020-02-12 DIAGNOSIS — M47816 Spondylosis without myelopathy or radiculopathy, lumbar region: Secondary | ICD-10-CM | POA: Diagnosis not present

## 2020-02-12 DIAGNOSIS — G894 Chronic pain syndrome: Secondary | ICD-10-CM | POA: Diagnosis not present

## 2020-02-25 DIAGNOSIS — E785 Hyperlipidemia, unspecified: Secondary | ICD-10-CM | POA: Diagnosis not present

## 2020-02-25 DIAGNOSIS — F411 Generalized anxiety disorder: Secondary | ICD-10-CM | POA: Diagnosis not present

## 2020-02-25 DIAGNOSIS — E1140 Type 2 diabetes mellitus with diabetic neuropathy, unspecified: Secondary | ICD-10-CM | POA: Diagnosis not present

## 2020-02-25 DIAGNOSIS — F410 Panic disorder [episodic paroxysmal anxiety] without agoraphobia: Secondary | ICD-10-CM | POA: Diagnosis not present

## 2020-02-25 DIAGNOSIS — E1165 Type 2 diabetes mellitus with hyperglycemia: Secondary | ICD-10-CM | POA: Diagnosis not present

## 2020-03-19 DIAGNOSIS — M48061 Spinal stenosis, lumbar region without neurogenic claudication: Secondary | ICD-10-CM | POA: Diagnosis not present

## 2020-03-19 DIAGNOSIS — M47816 Spondylosis without myelopathy or radiculopathy, lumbar region: Secondary | ICD-10-CM | POA: Diagnosis not present

## 2020-03-19 DIAGNOSIS — G894 Chronic pain syndrome: Secondary | ICD-10-CM | POA: Diagnosis not present

## 2020-03-19 DIAGNOSIS — Z1389 Encounter for screening for other disorder: Secondary | ICD-10-CM | POA: Diagnosis not present

## 2020-03-19 DIAGNOSIS — F99 Mental disorder, not otherwise specified: Secondary | ICD-10-CM | POA: Diagnosis not present

## 2020-03-19 DIAGNOSIS — M5416 Radiculopathy, lumbar region: Secondary | ICD-10-CM | POA: Diagnosis not present

## 2020-03-19 DIAGNOSIS — Z79891 Long term (current) use of opiate analgesic: Secondary | ICD-10-CM | POA: Diagnosis not present

## 2020-03-19 DIAGNOSIS — M5126 Other intervertebral disc displacement, lumbar region: Secondary | ICD-10-CM | POA: Diagnosis not present

## 2020-03-19 DIAGNOSIS — R202 Paresthesia of skin: Secondary | ICD-10-CM | POA: Diagnosis not present

## 2020-04-16 DIAGNOSIS — M47816 Spondylosis without myelopathy or radiculopathy, lumbar region: Secondary | ICD-10-CM | POA: Diagnosis not present

## 2020-04-16 DIAGNOSIS — M5416 Radiculopathy, lumbar region: Secondary | ICD-10-CM | POA: Diagnosis not present

## 2020-04-16 DIAGNOSIS — M48061 Spinal stenosis, lumbar region without neurogenic claudication: Secondary | ICD-10-CM | POA: Diagnosis not present

## 2020-04-16 DIAGNOSIS — Z1389 Encounter for screening for other disorder: Secondary | ICD-10-CM | POA: Diagnosis not present

## 2020-04-16 DIAGNOSIS — Z79891 Long term (current) use of opiate analgesic: Secondary | ICD-10-CM | POA: Diagnosis not present

## 2020-04-16 DIAGNOSIS — M5126 Other intervertebral disc displacement, lumbar region: Secondary | ICD-10-CM | POA: Diagnosis not present

## 2020-04-16 DIAGNOSIS — G894 Chronic pain syndrome: Secondary | ICD-10-CM | POA: Diagnosis not present

## 2020-04-21 DIAGNOSIS — G4733 Obstructive sleep apnea (adult) (pediatric): Secondary | ICD-10-CM | POA: Diagnosis not present

## 2020-04-21 DIAGNOSIS — J4520 Mild intermittent asthma, uncomplicated: Secondary | ICD-10-CM | POA: Diagnosis not present

## 2020-04-21 DIAGNOSIS — J301 Allergic rhinitis due to pollen: Secondary | ICD-10-CM | POA: Diagnosis not present

## 2020-04-21 DIAGNOSIS — R5383 Other fatigue: Secondary | ICD-10-CM | POA: Diagnosis not present

## 2020-04-24 DIAGNOSIS — H40003 Preglaucoma, unspecified, bilateral: Secondary | ICD-10-CM | POA: Diagnosis not present

## 2020-04-28 DIAGNOSIS — Z9181 History of falling: Secondary | ICD-10-CM | POA: Diagnosis not present

## 2020-04-28 DIAGNOSIS — Z6830 Body mass index (BMI) 30.0-30.9, adult: Secondary | ICD-10-CM | POA: Diagnosis not present

## 2020-04-28 DIAGNOSIS — Z0000 Encounter for general adult medical examination without abnormal findings: Secondary | ICD-10-CM | POA: Diagnosis not present

## 2020-04-28 DIAGNOSIS — E785 Hyperlipidemia, unspecified: Secondary | ICD-10-CM | POA: Diagnosis not present

## 2020-04-28 DIAGNOSIS — Z23 Encounter for immunization: Secondary | ICD-10-CM | POA: Diagnosis not present

## 2020-04-28 DIAGNOSIS — Z139 Encounter for screening, unspecified: Secondary | ICD-10-CM | POA: Diagnosis not present

## 2020-04-28 DIAGNOSIS — Z1331 Encounter for screening for depression: Secondary | ICD-10-CM | POA: Diagnosis not present

## 2020-05-11 DIAGNOSIS — Z01818 Encounter for other preprocedural examination: Secondary | ICD-10-CM | POA: Diagnosis not present

## 2020-05-11 DIAGNOSIS — H25811 Combined forms of age-related cataract, right eye: Secondary | ICD-10-CM | POA: Diagnosis not present

## 2020-05-11 DIAGNOSIS — H2511 Age-related nuclear cataract, right eye: Secondary | ICD-10-CM | POA: Diagnosis not present

## 2020-05-14 DIAGNOSIS — G894 Chronic pain syndrome: Secondary | ICD-10-CM | POA: Diagnosis not present

## 2020-05-14 DIAGNOSIS — M47816 Spondylosis without myelopathy or radiculopathy, lumbar region: Secondary | ICD-10-CM | POA: Diagnosis not present

## 2020-05-14 DIAGNOSIS — M48061 Spinal stenosis, lumbar region without neurogenic claudication: Secondary | ICD-10-CM | POA: Diagnosis not present

## 2020-05-14 DIAGNOSIS — M5416 Radiculopathy, lumbar region: Secondary | ICD-10-CM | POA: Diagnosis not present

## 2020-05-14 DIAGNOSIS — Z79891 Long term (current) use of opiate analgesic: Secondary | ICD-10-CM | POA: Diagnosis not present

## 2020-05-14 DIAGNOSIS — Z1389 Encounter for screening for other disorder: Secondary | ICD-10-CM | POA: Diagnosis not present

## 2020-05-14 DIAGNOSIS — M5126 Other intervertebral disc displacement, lumbar region: Secondary | ICD-10-CM | POA: Diagnosis not present

## 2020-06-08 DIAGNOSIS — H2512 Age-related nuclear cataract, left eye: Secondary | ICD-10-CM | POA: Diagnosis not present

## 2020-06-11 DIAGNOSIS — M5416 Radiculopathy, lumbar region: Secondary | ICD-10-CM | POA: Diagnosis not present

## 2020-06-11 DIAGNOSIS — Z79891 Long term (current) use of opiate analgesic: Secondary | ICD-10-CM | POA: Diagnosis not present

## 2020-06-11 DIAGNOSIS — M5126 Other intervertebral disc displacement, lumbar region: Secondary | ICD-10-CM | POA: Diagnosis not present

## 2020-06-11 DIAGNOSIS — Z1389 Encounter for screening for other disorder: Secondary | ICD-10-CM | POA: Diagnosis not present

## 2020-06-11 DIAGNOSIS — G894 Chronic pain syndrome: Secondary | ICD-10-CM | POA: Diagnosis not present

## 2020-06-11 DIAGNOSIS — M47816 Spondylosis without myelopathy or radiculopathy, lumbar region: Secondary | ICD-10-CM | POA: Diagnosis not present

## 2020-06-11 DIAGNOSIS — M48061 Spinal stenosis, lumbar region without neurogenic claudication: Secondary | ICD-10-CM | POA: Diagnosis not present

## 2020-06-30 DIAGNOSIS — I739 Peripheral vascular disease, unspecified: Secondary | ICD-10-CM | POA: Diagnosis not present

## 2020-06-30 DIAGNOSIS — M5416 Radiculopathy, lumbar region: Secondary | ICD-10-CM | POA: Diagnosis not present

## 2020-06-30 DIAGNOSIS — E1140 Type 2 diabetes mellitus with diabetic neuropathy, unspecified: Secondary | ICD-10-CM | POA: Diagnosis not present

## 2020-06-30 DIAGNOSIS — E1159 Type 2 diabetes mellitus with other circulatory complications: Secondary | ICD-10-CM | POA: Diagnosis not present

## 2020-06-30 DIAGNOSIS — E1165 Type 2 diabetes mellitus with hyperglycemia: Secondary | ICD-10-CM | POA: Diagnosis not present

## 2020-06-30 DIAGNOSIS — F411 Generalized anxiety disorder: Secondary | ICD-10-CM | POA: Diagnosis not present

## 2020-06-30 DIAGNOSIS — M48061 Spinal stenosis, lumbar region without neurogenic claudication: Secondary | ICD-10-CM | POA: Diagnosis not present

## 2020-06-30 DIAGNOSIS — F410 Panic disorder [episodic paroxysmal anxiety] without agoraphobia: Secondary | ICD-10-CM | POA: Diagnosis not present

## 2020-06-30 DIAGNOSIS — Z23 Encounter for immunization: Secondary | ICD-10-CM | POA: Diagnosis not present

## 2020-06-30 DIAGNOSIS — E785 Hyperlipidemia, unspecified: Secondary | ICD-10-CM | POA: Diagnosis not present

## 2020-06-30 DIAGNOSIS — M109 Gout, unspecified: Secondary | ICD-10-CM | POA: Diagnosis not present

## 2020-06-30 DIAGNOSIS — I10 Essential (primary) hypertension: Secondary | ICD-10-CM | POA: Diagnosis not present

## 2020-06-30 DIAGNOSIS — I2510 Atherosclerotic heart disease of native coronary artery without angina pectoris: Secondary | ICD-10-CM | POA: Diagnosis not present

## 2020-07-06 DIAGNOSIS — M48061 Spinal stenosis, lumbar region without neurogenic claudication: Secondary | ICD-10-CM | POA: Diagnosis not present

## 2020-07-06 DIAGNOSIS — Z1389 Encounter for screening for other disorder: Secondary | ICD-10-CM | POA: Diagnosis not present

## 2020-07-06 DIAGNOSIS — M5416 Radiculopathy, lumbar region: Secondary | ICD-10-CM | POA: Diagnosis not present

## 2020-07-06 DIAGNOSIS — M47816 Spondylosis without myelopathy or radiculopathy, lumbar region: Secondary | ICD-10-CM | POA: Diagnosis not present

## 2020-07-06 DIAGNOSIS — M5126 Other intervertebral disc displacement, lumbar region: Secondary | ICD-10-CM | POA: Diagnosis not present

## 2020-07-06 DIAGNOSIS — G894 Chronic pain syndrome: Secondary | ICD-10-CM | POA: Diagnosis not present

## 2020-07-06 DIAGNOSIS — Z79891 Long term (current) use of opiate analgesic: Secondary | ICD-10-CM | POA: Diagnosis not present

## 2020-07-14 DIAGNOSIS — M5432 Sciatica, left side: Secondary | ICD-10-CM | POA: Diagnosis not present

## 2020-07-14 DIAGNOSIS — Z6830 Body mass index (BMI) 30.0-30.9, adult: Secondary | ICD-10-CM | POA: Diagnosis not present

## 2020-07-14 DIAGNOSIS — Z79899 Other long term (current) drug therapy: Secondary | ICD-10-CM | POA: Diagnosis not present

## 2020-08-03 DIAGNOSIS — M47816 Spondylosis without myelopathy or radiculopathy, lumbar region: Secondary | ICD-10-CM | POA: Diagnosis not present

## 2020-08-03 DIAGNOSIS — M48061 Spinal stenosis, lumbar region without neurogenic claudication: Secondary | ICD-10-CM | POA: Diagnosis not present

## 2020-08-03 DIAGNOSIS — M5126 Other intervertebral disc displacement, lumbar region: Secondary | ICD-10-CM | POA: Diagnosis not present

## 2020-08-03 DIAGNOSIS — G894 Chronic pain syndrome: Secondary | ICD-10-CM | POA: Diagnosis not present

## 2020-08-03 DIAGNOSIS — Z79891 Long term (current) use of opiate analgesic: Secondary | ICD-10-CM | POA: Diagnosis not present

## 2020-08-03 DIAGNOSIS — M5416 Radiculopathy, lumbar region: Secondary | ICD-10-CM | POA: Diagnosis not present

## 2020-08-03 DIAGNOSIS — Z1389 Encounter for screening for other disorder: Secondary | ICD-10-CM | POA: Diagnosis not present

## 2020-08-24 DIAGNOSIS — J4520 Mild intermittent asthma, uncomplicated: Secondary | ICD-10-CM | POA: Diagnosis not present

## 2020-08-24 DIAGNOSIS — R5383 Other fatigue: Secondary | ICD-10-CM | POA: Diagnosis not present

## 2020-08-24 DIAGNOSIS — J301 Allergic rhinitis due to pollen: Secondary | ICD-10-CM | POA: Diagnosis not present

## 2020-08-24 DIAGNOSIS — G4733 Obstructive sleep apnea (adult) (pediatric): Secondary | ICD-10-CM | POA: Diagnosis not present

## 2020-08-31 DIAGNOSIS — M47816 Spondylosis without myelopathy or radiculopathy, lumbar region: Secondary | ICD-10-CM | POA: Diagnosis not present

## 2020-08-31 DIAGNOSIS — M48061 Spinal stenosis, lumbar region without neurogenic claudication: Secondary | ICD-10-CM | POA: Diagnosis not present

## 2020-08-31 DIAGNOSIS — Z79891 Long term (current) use of opiate analgesic: Secondary | ICD-10-CM | POA: Diagnosis not present

## 2020-08-31 DIAGNOSIS — G894 Chronic pain syndrome: Secondary | ICD-10-CM | POA: Diagnosis not present

## 2020-08-31 DIAGNOSIS — Z1389 Encounter for screening for other disorder: Secondary | ICD-10-CM | POA: Diagnosis not present

## 2020-08-31 DIAGNOSIS — M5416 Radiculopathy, lumbar region: Secondary | ICD-10-CM | POA: Diagnosis not present

## 2020-08-31 DIAGNOSIS — M5126 Other intervertebral disc displacement, lumbar region: Secondary | ICD-10-CM | POA: Diagnosis not present

## 2020-09-04 DIAGNOSIS — H40023 Open angle with borderline findings, high risk, bilateral: Secondary | ICD-10-CM | POA: Diagnosis not present

## 2022-11-10 DIAGNOSIS — R079 Chest pain, unspecified: Secondary | ICD-10-CM | POA: Diagnosis not present

## 2023-03-16 ENCOUNTER — Telehealth: Payer: Self-pay | Admitting: Cardiology

## 2023-03-16 NOTE — Telephone Encounter (Signed)
Left message for patient to call office to schedule urgent appt with Dr. Bing Matter.  **Please contact [facility] front desk to add pt on per Dr. Bing Matter**

## 2023-03-22 ENCOUNTER — Encounter: Payer: Self-pay | Admitting: Cardiology

## 2023-03-22 ENCOUNTER — Ambulatory Visit: Payer: Medicare Other | Attending: Cardiology | Admitting: Cardiology

## 2023-03-22 VITALS — BP 112/68 | HR 58 | Ht 65.0 in | Wt 179.2 lb

## 2023-03-22 DIAGNOSIS — I7121 Aneurysm of the ascending aorta, without rupture: Secondary | ICD-10-CM | POA: Diagnosis not present

## 2023-03-22 DIAGNOSIS — I159 Secondary hypertension, unspecified: Secondary | ICD-10-CM | POA: Diagnosis not present

## 2023-03-22 DIAGNOSIS — I712 Thoracic aortic aneurysm, without rupture, unspecified: Secondary | ICD-10-CM | POA: Insufficient documentation

## 2023-03-22 DIAGNOSIS — I2510 Atherosclerotic heart disease of native coronary artery without angina pectoris: Secondary | ICD-10-CM | POA: Insufficient documentation

## 2023-03-22 DIAGNOSIS — E785 Hyperlipidemia, unspecified: Secondary | ICD-10-CM

## 2023-03-22 MED ORDER — ATORVASTATIN CALCIUM 10 MG PO TABS: 10.0000 mg | ORAL_TABLET | Freq: Every day | ORAL | 3 refills | Status: DC

## 2023-03-22 NOTE — Progress Notes (Signed)
Cardiology Consultation:    Date:  03/22/2023   ID:  Michael Friedman, DOB Dec 07, 1945, MRN [id number]  PCP:  Michael Floor., MD  Cardiologist:  Michael Balsam, MD   Referring MD: Michael Floor., MD   Chief Complaint  Patient presents with   Chest Pain   Shortness of Breath         History of Present Illness:    Michael Friedman is a 77 y.o. male who is being seen today for the evaluation of coronary artery disease at the request of Michael Floor., MD. past medical history significant for coronary artery disease by prior team 2005 he required multiple stent to all arteries.  Since that time he tells me he got 2 more cardiac catheterization with no intervention has been needed.  Since that time he seems to be doing fine.  He is very hard of hearing and a conversation with him somewhat difficult.  Additional past medical history includes dyslipidemia, he is not on statin, essential hypertension, recently CT of the chest done for PE showed no evidence of PE but described enlargement of the ascending aorta measuring 40 mm.  He comes today to my office because he would like to be established as a patient.  He describes different kind of chest pain somewhat vague in the description look like majority of time the right side of his chest but bothers him somewhat.  He is trying to be active trying to walk some but does have some difficulty with the right leg.  Does not smoke quit smoking many years ago.  He does have family history of coronary artery disease but not premature.  Past Medical History:  Diagnosis Date   CAD (coronary artery disease)    3 vessel CAD. s/p PCI and DES to RCA, LCX and LAD   GERD (gastroesophageal reflux disease)    Hearing difficulty    HLD (hyperlipidemia)    HTN (hypertension)     Past Surgical History:  Procedure Laterality Date   CARDIAC CATHETERIZATION     multiple. Most recent in July of 2011: 99% in-stent restenosis in the mid LAD   CORONARY  ANGIOPLASTY WITH STENT PLACEMENT     Cypher stent to RCA and LAD. Taxus stent to LCx. Instent restenosis and LAD was treated with a Promus drug-eluting stent    Current Medications: Current Meds  Medication Sig   ACCU-CHEK FASTCLIX LANCETS MISC 1 each by Other route daily.   aspirin 325 MG tablet Take 325 mg by mouth daily.     BD ULTRA-FINE PEN NEEDLES 29G X 12.7MM MISC 1 each by Other route daily.   buprenorphine (BUTRANS) 10 MCG/HR PTWK Place 1 patch onto the skin once a week.   celecoxib (CELEBREX) 200 MG capsule Take 200 mg by mouth daily.   HYDROcodone-acetaminophen (VICODIN) 5-500 MG per tablet Take 1 tablet by mouth Three times daily as needed for pain.   IBUPROFEN PO Take 1 tablet by mouth as needed (pain).   isosorbide mononitrate (IMDUR) 30 MG 24 hr tablet Take 1 tablet by mouth daily.   Lidocaine 3.5 % PTCH Apply 1 patch topically as needed (pain).   LYRICA 50 MG capsule Take 50 mg by mouth 3 (three) times daily as needed (nerve pain).   montelukast (SINGULAIR) 10 MG tablet Take 10 mg by mouth at bedtime.   naloxone (NARCAN) nasal spray 4 mg/0.1 mL Place 0.4 mg into the nose as needed (od).   Nintedanib Esylate (OFEV PO) Take  50 mg by mouth every 12 (twelve) hours.   NITROSTAT 0.4 MG SL tablet Take 1 tablet by mouth as needed for chest pain.   omeprazole (PRILOSEC) 40 MG capsule Take 1 tablet by mouth daily.   OVER THE COUNTER MEDICATION Take 1 tablet by mouth daily.   PLAVIX 75 MG tablet Take 1 tablet by mouth daily.   Probiotic Product (PROBIOTIC-10 PO) Take 1 tablet by mouth daily.   ULORIC 80 MG TABS Take 1 tablet by mouth daily.   Vitamin D, Ergocalciferol, (DRISDOL) 50000 UNITS CAPS capsule Take 50,000 Units by mouth every 7 (seven) days.   [DISCONTINUED] ALPRAZolam (XANAX) 0.25 MG tablet Take 1 tablet by mouth Twice daily.   [DISCONTINUED] atorvastatin (LIPITOR) 80 MG tablet Take 1 tablet by mouth daily.   [DISCONTINUED] colesevelam (WELCHOL) 625 MG tablet Take 1,875  mg by mouth 2 (two) times daily with a meal.     [DISCONTINUED] dicyclomine (BENTYL) 10 MG capsule Take 10 mg by mouth 4 (four) times daily as needed for spasms.   [DISCONTINUED] folic acid (FOLVITE) 1 MG tablet Take 1 tablet by mouth daily.   [DISCONTINUED] fosinopril (MONOPRIL) 10 MG tablet Take 10 mg by mouth daily.   [DISCONTINUED] metoprolol succinate (TOPROL-XL) 25 MG 24 hr tablet Take 1 tablet by mouth Daily.   [DISCONTINUED] naproxen sodium (ANAPROX) 220 MG tablet Take 220 mg by mouth as needed (pain).     Allergies:   Patient has no known allergies.   Social History   Socioeconomic History   Marital status: Single    Spouse name: Not on file   Number of children: Not on file   Years of education: Not on file   Highest education level: Not on file  Occupational History   Not on file  Tobacco Use   Smoking status: Former    Types: Cigarettes    Quit date: 09/20/1987    Years since quitting: 35.5   Smokeless tobacco: Not on file  Substance and Sexual Activity   Alcohol use: Yes    Alcohol/week: 18.0 standard drinks of alcohol    Types: 18 Cans of beer per week   Drug use: No   Sexual activity: Not on file  Other Topics Concern   Not on file  Social History Narrative   Not on file   Social Determinants of Health   Financial Resource Strain: Not on file  Food Insecurity: Not on file  Transportation Needs: Not on file  Physical Activity: Not on file  Stress: Not on file  Social Connections: Not on file     Family History: The patient's family history is not on file. ROS:   Please see the history of present illness.    All 14 point review of systems negative except as described per history of present illness.  EKGs/Labs/Other Studies Reviewed:    The following studies were reviewed today: EKG shows sinus bradycardia, normal P interval, cannot rule out anteroseptal wall MI  EKG:       Recent Labs: No results found for requested labs within last 365 days.   Recent Lipid Panel No results found for: "CHOL", "TRIG", "HDL", "CHOLHDL", "VLDL", "LDLCALC", "LDLDIRECT"  Physical Exam:    VS:  BP 112/68 (BP Location: Left Arm, Patient Position: Sitting)   Pulse (!) 58   Ht 5\' 5"  (1.651 m)   Wt 179 lb 3.2 oz (81.3 kg)   SpO2 95%   BMI 29.82 kg/m     Wt Readings from Last  3 Encounters:  03/22/23 179 lb 3.2 oz (81.3 kg)  05/02/11 203 lb (92.1 kg)     GEN:  Well nourished, well developed in no acute distress HEENT: Normal NECK: No JVD; No carotid bruits LYMPHATICS: No lymphadenopathy CARDIAC: RRR, no murmurs, no rubs, no gallops RESPIRATORY:  Clear to auscultation without rales, wheezing or rhonchi  ABDOMEN: Soft, non-tender, non-distended MUSCULOSKELETAL:  No edema; No deformity  SKIN: Warm and dry NEUROLOGIC:  Alert and oriented x 3 PSYCHIATRIC:  Normal affect   ASSESSMENT:    1. Dyslipidemia   2. Secondary hypertension   3. Coronary artery disease, unspecified vessel or lesion type, unspecified whether angina present, unspecified whether native or transplanted heart   4. Aneurysm of ascending aorta without rupture (HCC)    PLAN:    In order of problems listed above:  Atypical chest pain with history of coronary artery disease.  I will schedule him to have Lexiscan to make sure he does not have any inducible ischemia.  In the meantime we will continue with risk factors modification which include antiplatelets therapy. Dyslipidemia I did review his K PN which show LDL of 87 HDL 51.  His gentleman with coronary artery disease according to guidelines he need to be on moderate intensity statin regardless of what his cholesterol is.  I will put him on Lipitor 10 mg daily.  Fasting lipid profile, AST ALT will be checked within the next 6 weeks or so. Ascending arctic aneurysm measuring 40 mm.  We discussed this issue with him.  I will do abdominal ultrasound to make sure he does not have an aneurysm in his abdominal portion of the aorta.  I  told him that we will require periodic check usually with this diameter of ascending aneurysm we will check every 3 to 5 years but initially a year apart to make sure there is no rapidly growing aneurysm.  Seen so far the only area of aneurysmal deformation that we identified involve stenting portion of the aorta will be able to follow-up with echocardiogram.   Medication Adjustments/Labs and Tests Ordered: Current medicines are reviewed at length with the patient today.  Concerns regarding medicines are outlined above.  Orders Placed This Encounter  Procedures   EKG 12-Lead   No orders of the defined types were placed in this encounter.   Signed, Georgeanna Lea, MD, [facility] 03/22/2023 12:08 PM    [facility]

## 2023-03-22 NOTE — Patient Instructions (Addendum)
Medication Instructions:   START: Atorvastatin 10mg  1 tablet daily   Lab Work: Your physician recommends that you return for lab work in: 6 weeks You need to have labs done when you are fasting.  You can come Monday through Friday 8:30 am to 12:00 pm and 1:15 to 4:30. You do not need to make an appointment as the order has already been placed. The labs you are going to have done are AST, ALT  Lipids.    Testing/Procedures: Your physician has requested that you have an abdominal aorta duplex. During this test, an ultrasound is used to evaluate the aorta. Allow 30 minutes for this exam. Do not eat after midnight the day before and avoid carbonated beverages   Your physician has requested that you have a lexiscan myoview. For further information please visit [email or web address] Please follow instruction sheet, as given.  The test will take approximately 3 to 4 hours to complete; you may bring reading material.  If someone comes with you to your appointment, they will need to remain in the main lobby due to limited space in the testing area.  How to prepare for your Myocardial Perfusion Test: Do not eat or drink 3 hours prior to your test, except you may have water. Do not consume products containing caffeine (regular or decaffeinated) 12 hours prior to your test. (ex: coffee, chocolate, sodas, tea). Do bring a list of your current medications with you.  If not listed below, you may take your medications as normal. Do wear comfortable clothes (no dresses or overalls) and walking shoes, tennis shoes preferred (No heels or open toe shoes are allowed). Do NOT wear cologne, perfume, aftershave, or lotions (deodorant is allowed). If these instructions are not followed, your test will have to be rescheduled.     Follow-Up: At [facility], you and your health needs are our priority.  As part of our continuing mission to provide you with exceptional heart care, we have created designated Provider  Care Teams.  These Care Teams include your primary Cardiologist (physician) and Advanced Practice Providers (APPs -  Physician Assistants and Nurse Practitioners) who all work together to provide you with the care you need, when you need it.  We recommend signing up for the patient portal called "MyChart".  Sign up information is provided on this After Visit Summary.  MyChart is used to connect with patients for Virtual Visits (Telemedicine).  Patients are able to view lab/test results, encounter notes, upcoming appointments, etc.  Non-urgent messages can be sent to your provider as well.   To learn more about what you can do with MyChart, go to [email or web address].    Your next appointment:   2 month(s)  The format for your next appointment:   In Person  Provider:   Gypsy Balsam, MD    Other Instructions NA

## 2023-03-28 NOTE — Telephone Encounter (Signed)
Left message on voicemail per DPR in reference to upcoming appointment scheduled on  03/30/23 with detailed instructions given per Myocardial Perfusion Study Information Sheet for the test. LM to arrive 15 minutes early, and that it is imperative to arrive on time for appointment to keep from having the test rescheduled. If you need to cancel or reschedule your appointment, please call the office within 24 hours of your appointment. Failure to do so may result in a cancellation of your appointment, and a $50 no show fee. Phone number given for call back for any questions. Ricky Ala

## 2023-03-30 ENCOUNTER — Ambulatory Visit: Payer: Medicare Other | Attending: Cardiology

## 2023-03-30 DIAGNOSIS — I2510 Atherosclerotic heart disease of native coronary artery without angina pectoris: Secondary | ICD-10-CM

## 2023-03-30 LAB — MYOCARDIAL PERFUSION IMAGING
LV dias vol: 77 mL (ref 62–150)
LV sys vol: 28 mL
Nuc Stress EF: 64 %
Peak HR: 85 {beats}/min
Rest HR: 55 {beats}/min
Rest Nuclear Isotope Dose: 10.8 mCi
SDS: 2
SRS: 8
SSS: 10
Stress Nuclear Isotope Dose: 29.4 mCi
TID: 1.02

## 2023-03-30 MED ORDER — REGADENOSON 0.4 MG/5ML IV SOLN
0.4000 mg | Freq: Once | INTRAVENOUS | Status: AC
Start: 2023-03-30 — End: 2023-03-30
  Administered 2023-03-30: 0.4 mg via INTRAVENOUS

## 2023-03-30 MED ORDER — TECHNETIUM TC 99M TETROFOSMIN IV KIT
29.4000 | PACK | Freq: Once | INTRAVENOUS | Status: AC | PRN
  Administered 2023-03-30: 29.4 via INTRAVENOUS

## 2023-03-30 MED ORDER — TECHNETIUM TC 99M TETROFOSMIN IV KIT
10.8000 | PACK | Freq: Once | INTRAVENOUS | Status: AC | PRN
  Administered 2023-03-30: 10.8 via INTRAVENOUS

## 2023-04-04 ENCOUNTER — Telehealth: Payer: Self-pay

## 2023-04-04 NOTE — Telephone Encounter (Signed)
Left a message informing the patient of results  on [phone] #

## 2023-04-04 NOTE — Telephone Encounter (Signed)
-----   Message from Gypsy Balsam sent at 04/03/2023  9:12 AM EDT ----- Stress test was negative

## 2023-04-19 ENCOUNTER — Ambulatory Visit: Payer: Medicare Other | Attending: Cardiology

## 2023-04-19 DIAGNOSIS — I7121 Aneurysm of the ascending aorta, without rupture: Secondary | ICD-10-CM | POA: Diagnosis not present

## 2023-04-21 ENCOUNTER — Telehealth: Payer: Self-pay

## 2023-04-21 NOTE — Telephone Encounter (Signed)
-----   Message from Gypsy Balsam sent at 04/21/2023  8:50 AM EDT ----- Abdominal ultrasound did not show any evidence of aneurysm

## 2023-04-21 NOTE — Telephone Encounter (Signed)
Patient notified through my chart.

## 2023-05-30 ENCOUNTER — Ambulatory Visit: Payer: Medicare Other | Attending: Cardiology | Admitting: Cardiology

## 2023-05-30 ENCOUNTER — Encounter: Payer: Self-pay | Admitting: Cardiology

## 2023-05-30 VITALS — BP 110/60 | HR 60 | Ht 65.0 in | Wt 175.0 lb

## 2023-05-30 DIAGNOSIS — I2510 Atherosclerotic heart disease of native coronary artery without angina pectoris: Secondary | ICD-10-CM | POA: Diagnosis not present

## 2023-05-30 DIAGNOSIS — E785 Hyperlipidemia, unspecified: Secondary | ICD-10-CM

## 2023-05-30 DIAGNOSIS — I159 Secondary hypertension, unspecified: Secondary | ICD-10-CM | POA: Diagnosis not present

## 2023-05-30 DIAGNOSIS — I7121 Aneurysm of the ascending aorta, without rupture: Secondary | ICD-10-CM | POA: Diagnosis present

## 2023-05-30 DIAGNOSIS — Z79899 Other long term (current) drug therapy: Secondary | ICD-10-CM

## 2023-05-30 NOTE — Progress Notes (Signed)
Cardiology Office Note:  .   Date:  05/30/2023  ID:  Michael Friedman, DOB [birth year], MRN [id number] PCP: Wilmer Floor., MD  [facility] Providers Cardiologist:  None    History of Present Illness: .   Michael Friedman is a 77 y.o. male with a past medical history of CAD s/p DES 2010, hypertension, TAA 40 mm, COPD, OSA, DM 1, dyslipidemia.  04/19/2023 AAA duplex negative for aneurysm 03/30/2023 Lexiscan normal, low risk study 03/19/2009 left heart cath PCI with BMS to mid RCA 2005 reported interventions with multiple stents per patient  Last evaluated by Dr. Bing Matter on 03/22/2023 to establish care, he was tried to increase his physical activity.  He did endorse some chest pain so Lexiscan was arranged Which was a normal, low risk study.  He was started on Lipitor 10 mg daily.  He had an abdominal ultrasound which revealed no aneurysm.  He presents today for follow-up of his CAD.  He offers no formal complaints that are cardiac in nature.  He does have some complaints of occasional left frontal headache that he feels is related to his allergies.  He also states that he has a decreased sensation to electricity and wants me to check some lab work on him today. He denies chest pain, palpitations, dyspnea, pnd, orthopnea, n, v, dizziness, syncope, edema, weight gain, or early satiety.    ROS: Review of Systems  Neurological:  Positive for headaches.     Studies Reviewed: .        Cardiac Studies & Procedures     STRESS TESTS  MYOCARDIAL PERFUSION IMAGING 03/30/2023  Narrative   The study is normal. The study is low risk.   Left ventricular function is normal. Nuclear stress EF: 64%. The left ventricular ejection fraction is normal (55-65%). End diastolic cavity size is normal.   Prior study available for comparison from 11/10/2022.              Risk Assessment/Calculations:             Physical Exam:   VS:  BP 110/60 (BP Location: Right Arm, Patient Position: Sitting, Cuff  Size: Normal)   Pulse 60   Ht 5\' 5"  (1.651 m)   Wt 175 lb (79.4 kg)   SpO2 95%   BMI 29.12 kg/m    Wt Readings from Last 3 Encounters:  05/30/23 175 lb (79.4 kg)  03/30/23 179 lb (81.2 kg)  03/22/23 179 lb 3.2 oz (81.3 kg)    GEN: Well nourished, well developed in no acute distress NECK: No JVD; No carotid bruits CARDIAC: RRR, no murmurs, rubs, gallops RESPIRATORY:  Clear to auscultation without rales, wheezing or rhonchi  ABDOMEN: Soft, non-tender, non-distended EXTREMITIES:  No edema; No deformity   ASSESSMENT AND PLAN: .   Coronary artery disease-s/p PCI and DES in 2010 with BMS to mRCA. Stable with no anginal symptoms. No indication for ischemic evaluation.  Continue aspirin 81 mg daily, continue Plavix 75 mg daily, continue Lipitor 10 mg daily, continue Imdur 30 mg daily, continue nitroglycerin as needed.  Is not currently on a beta-blocker as his heart rate was dropping into the 40s at times.  Dyslipidemia-most recent LDL was well-controlled at 53 on 05/03/2023, continue Lipitor 10 mg daily.  This appears to be monitored by his PCP. Hypertension-blood pressure is well-controlled at 110/60, not currently on any antihypertensive medications. TAA- dilated at 40 mm, AAA duplex was negative for aneurysm. His BP is well controlled and he is not  a smoker.        Dispo: 6 months, CBC BMET.   Signed, Flossie Dibble, NP

## 2023-05-30 NOTE — Patient Instructions (Signed)
Medication Instructions:  Your physician recommends that you continue on your current medications as directed. Please refer to the Current Medication list given to you today.  *If you need a refill on your cardiac medications before your next appointment, please call your pharmacy*   Lab Work: Your physician recommends that you return for lab work in: Today for a BMP and CBC  If you have labs (blood work) drawn today and your tests are completely normal, you will receive your results only by: MyChart Message (if you have MyChart) OR A paper copy in the mail If you have any lab test that is abnormal or we need to change your treatment, we will call you to review the results.   Testing/Procedures: NONE    Follow-Up: At [facility], you and your health needs are our priority.  As part of our continuing mission to provide you with exceptional heart care, we have created designated Provider Care Teams.  These Care Teams include your primary Cardiologist (physician) and Advanced Practice Providers (APPs -  Physician Assistants and Nurse Practitioners) who all work together to provide you with the care you need, when you need it.  We recommend signing up for the patient portal called "MyChart".  Sign up information is provided on this After Visit Summary.  MyChart is used to connect with patients for Virtual Visits (Telemedicine).  Patients are able to view lab/test results, encounter notes, upcoming appointments, etc.  Non-urgent messages can be sent to your provider as well.   To learn more about what you can do with MyChart, go to [email or web address].    Your next appointment:   6 month(s)  Provider:   Gypsy Balsam, MD    Other Instructions

## 2023-05-31 LAB — CBC WITH DIFFERENTIAL/PLATELET
Basophils Absolute: 0.1 x10E3/uL (ref 0.0–0.2)
Basos: 1 %
EOS (ABSOLUTE): 0.4 x10E3/uL (ref 0.0–0.4)
Eos: 7 %
Hematocrit: 35.7 % — ABNORMAL LOW (ref 37.5–51.0)
Hemoglobin: 12.1 g/dL — ABNORMAL LOW (ref 13.0–17.7)
Immature Grans (Abs): 0 x10E3/uL (ref 0.0–0.1)
Immature Granulocytes: 0 %
Lymphocytes Absolute: 0.8 x10E3/uL (ref 0.7–3.1)
Lymphs: 17 %
MCH: 32.5 pg (ref 26.6–33.0)
MCHC: 33.9 g/dL (ref 31.5–35.7)
MCV: 96 fL (ref 79–97)
Monocytes Absolute: 0.4 x10E3/uL (ref 0.1–0.9)
Monocytes: 9 %
Neutrophils Absolute: 3.2 x10E3/uL (ref 1.4–7.0)
Neutrophils: 66 %
Platelets: 169 x10E3/uL (ref 150–450)
RBC: 3.72 x10E6/uL — ABNORMAL LOW (ref 4.14–5.80)
RDW: 12.7 % (ref 11.6–15.4)
WBC: 4.8 x10E3/uL (ref 3.4–10.8)

## 2023-05-31 LAB — BASIC METABOLIC PANEL WITH GFR
BUN/Creatinine Ratio: 17 (ref 10–24)
BUN: 19 mg/dL (ref 8–27)
CO2: 25 mmol/L (ref 20–29)
Calcium: 8.8 mg/dL (ref 8.6–10.2)
Chloride: 105 mmol/L (ref 96–106)
Creatinine, Ser: 1.14 mg/dL (ref 0.76–1.27)
Glucose: 95 mg/dL (ref 70–99)
Potassium: 4.2 mmol/L (ref 3.5–5.2)
Sodium: 143 mmol/L (ref 134–144)
eGFR: 66 mL/min/1.73

## 2024-01-04 ENCOUNTER — Ambulatory Visit: Attending: Cardiology | Admitting: Cardiology

## 2024-01-04 VITALS — BP 138/72 | HR 65 | Ht 65.0 in | Wt 180.0 lb

## 2024-01-04 DIAGNOSIS — E785 Hyperlipidemia, unspecified: Secondary | ICD-10-CM | POA: Insufficient documentation

## 2024-01-04 DIAGNOSIS — I159 Secondary hypertension, unspecified: Secondary | ICD-10-CM | POA: Diagnosis present

## 2024-01-04 DIAGNOSIS — I7121 Aneurysm of the ascending aorta, without rupture: Secondary | ICD-10-CM | POA: Diagnosis present

## 2024-01-04 DIAGNOSIS — I2510 Atherosclerotic heart disease of native coronary artery without angina pectoris: Secondary | ICD-10-CM | POA: Insufficient documentation

## 2024-01-04 DIAGNOSIS — M79606 Pain in leg, unspecified: Secondary | ICD-10-CM | POA: Diagnosis present

## 2024-01-04 NOTE — Progress Notes (Signed)
 Cardiology Office Note:    Date:  01/04/2024   ID:  Michael Friedman, DOB 1946-02-04, MRN [id number]  PCP:  Wilmer Floor., MD  Cardiologist:  Gypsy Balsam, MD    Referring MD: Wilmer Floor., MD   Chief Complaint  Patient presents with   Follow-up    History of Present Illness:    Michael Friedman is a 78 y.o. male past medical history significant for coronary artery disease in 2010 he did have cardiac catheterization with bare-metal stent to mid RCA, he also reports multiple previous intervention but we have no documentation of it, in summer of last year he did have Lexiscan which was negative, he also got thoracic aortic aneurysm up to 40 mm, asymptomatic, COPD, obstructive sleep apnea, diabetes type 1, dyslipidemia.  Comes today to my office for follow-up.  Like always discussion somewhat difficult because of his hard of hearing.  Initially said everything is fine no chest pain tightness squeezing pressure burning chest but he also tells me that he does not do much.  He understand that there is a problem trying to build be more active he described a situation that his right lower extremity can develop get weak tired exhausted and simply give up  Past Medical History:  Diagnosis Date   CAD (coronary artery disease)    3 vessel CAD. s/p PCI and DES to RCA, LCX and LAD   GERD (gastroesophageal reflux disease)    Hearing difficulty    HLD (hyperlipidemia)    HTN (hypertension)     Past Surgical History:  Procedure Laterality Date   CARDIAC CATHETERIZATION     multiple. Most recent in July of 2011: 99% in-stent restenosis in the mid LAD   CORONARY ANGIOPLASTY WITH STENT PLACEMENT     Cypher stent to RCA and LAD. Taxus stent to LCx. Instent restenosis and LAD was treated with a Promus drug-eluting stent    Current Medications: Current Meds  Medication Sig   ACCU-CHEK FASTCLIX LANCETS MISC 1 each by Other route daily.   aspirin EC 81 MG tablet Take 81 mg by mouth daily.    atorvastatin (LIPITOR) 10 MG tablet Take 1 tablet (10 mg total) by mouth daily.   BD ULTRA-FINE PEN NEEDLES 29G X 12.7MM MISC 1 each by Other route daily.   buprenorphine (BUTRANS) 10 MCG/HR PTWK Place 1 patch onto the skin once a week.   celecoxib (CELEBREX) 200 MG capsule Take 200 mg by mouth daily.   IBUPROFEN PO Take 1 tablet by mouth as needed (pain).   isosorbide mononitrate (IMDUR) 30 MG 24 hr tablet Take 1 tablet by mouth daily.   lidocaine (LIDODERM) 5 % Apply 1 patch topically as needed (pain).   LYRICA 50 MG capsule Take 50 mg by mouth 3 (three) times daily as needed (nerve pain).   montelukast (SINGULAIR) 10 MG tablet Take 10 mg by mouth at bedtime.   naloxone (NARCAN) nasal spray 4 mg/0.1 mL Place 0.4 mg into the nose as needed (od).   Nintedanib (OFEV) 150 MG CAPS Take 150 mg by mouth every 12 (twelve) hours.   NITROSTAT 0.4 MG SL tablet Take 1 tablet by mouth as needed for chest pain.   omeprazole (PRILOSEC) 40 MG capsule Take 1 tablet by mouth daily.   OVER THE COUNTER MEDICATION Take 1 tablet by mouth daily.   PLAVIX 75 MG tablet Take 1 tablet by mouth daily.   Probiotic Product (PROBIOTIC-10 PO) Take 1 tablet by mouth daily.   sertraline (  ZOLOFT) 50 MG tablet Take 50 mg by mouth daily.   ULORIC 80 MG TABS Take 1 tablet by mouth daily.   Vitamin D, Ergocalciferol, (DRISDOL) 50000 UNITS CAPS capsule Take 50,000 Units by mouth every 7 (seven) days.     Allergies:   Patient has no known allergies.   Social History   Socioeconomic History   Marital status: Single    Spouse name: Not on file   Number of children: Not on file   Years of education: Not on file   Highest education level: Not on file  Occupational History   Not on file  Tobacco Use   Smoking status: Former    Current packs/day: 0.00    Types: Cigarettes    Quit date: 09/20/1987    Years since quitting: 36.3   Smokeless tobacco: Not on file  Substance and Sexual Activity   Alcohol use: Yes     Alcohol/week: 18.0 standard drinks of alcohol    Types: 18 Cans of beer per week   Drug use: No   Sexual activity: Not on file  Other Topics Concern   Not on file  Social History Narrative   Not on file   Social Drivers of Health   Financial Resource Strain: Not on file  Food Insecurity: Not on file  Transportation Needs: Not on file  Physical Activity: Not on file  Stress: Not on file  Social Connections: Not on file     Family History: The patient's family history is not on file. ROS:   Please see the history of present illness.    All 14 point review of systems negative except as described per history of present illness  EKGs/Labs/Other Studies Reviewed:         Recent Labs: 05/03/2023: ALT 17 05/30/2023: BUN 19; Creatinine, Ser 1.14; Hemoglobin 12.1; Platelets 169; Potassium 4.2; Sodium 143  Recent Lipid Panel    Component Value Date/Time   CHOL 117 05/03/2023 1537   TRIG 66 05/03/2023 1537   HDL 50 05/03/2023 1537   CHOLHDL 2.3 05/03/2023 1537   LDLCALC 53 05/03/2023 1537    Physical Exam:    VS:  BP 138/72 (BP Location: Left Arm, Patient Position: Sitting)   Pulse 65   Ht 5\' 5"  (1.651 m)   Wt 180 lb (81.6 kg)   SpO2 92%   BMI 29.95 kg/m     Wt Readings from Last 3 Encounters:  01/04/24 180 lb (81.6 kg)  05/30/23 175 lb (79.4 kg)  03/30/23 179 lb (81.2 kg)     GEN:  Well nourished, well developed in no acute distress HEENT: Normal NECK: No JVD; No carotid bruits LYMPHATICS: No lymphadenopathy CARDIAC: RRR, no murmurs, no rubs, no gallops RESPIRATORY:  Clear to auscultation without rales, wheezing or rhonchi  ABDOMEN: Soft, non-tender, non-distended MUSCULOSKELETAL:  No edema; No deformity  SKIN: Warm and dry LOWER EXTREMITIES: no swelling NEUROLOGIC:  Alert and oriented x 3 PSYCHIATRIC:  Normal affect   ASSESSMENT:    1. Coronary artery disease, unspecified vessel or lesion type, unspecified whether angina present, unspecified whether  native or transplanted heart   2. Aneurysm of ascending aorta without rupture (HCC)   3. Secondary hypertension   4. Dyslipidemia    PLAN:    In order of problems listed above:  Coronary disease stable stress test reviewed with the patient asymptomatic continue present management. Aneurysm of the ascending aorta, stable. Essential hypertension blood pressure well-controlled continue present management. Dyslipidemia I did review his  lab work test last data from summer of last year with LDL 53 HDL 50 will repeat fasting lipid profile. Pain in the lower extremities with some getting up.  Will do arterial duplex to make sure he does not have any significant stenosis   Medication Adjustments/Labs and Tests Ordered: Current medicines are reviewed at length with the patient today.  Concerns regarding medicines are outlined above.  No orders of the defined types were placed in this encounter.  Medication changes: No orders of the defined types were placed in this encounter.   Signed, Manfred Seed, MD, [facility] 01/04/2024 2:52 PM    [facility]

## 2024-01-04 NOTE — Addendum Note (Signed)
 Addended by: Shawnee Dellen D on: 01/04/2024 03:08 PM   Modules accepted: Orders

## 2024-01-04 NOTE — Patient Instructions (Signed)
 Medication Instructions:  Your physician recommends that you continue on your current medications as directed. Please refer to the Current Medication list given to you today.  *If you need a refill on your cardiac medications before your next appointment, please call your pharmacy*   Lab Work: Lipid, AST, ALT- today If you have labs (blood work) drawn today and your tests are completely normal, you will receive your results only by: MyChart Message (if you have MyChart) OR A paper copy in the mail If you have any lab test that is abnormal or we need to change your treatment, we will call you to review the results.   Testing/Procedures: Your physician has requested that you have a lower or upper extremity arterial duplex. This test is an ultrasound of the arteries in the legs or arms. It looks at arterial blood flow in the legs and arms. Allow one hour for Lower and Upper Arterial scans. There are no restrictions or special instructions.  Your physician has requested that you have an ankle brachial index (ABI). During this test an ultrasound and blood pressure cuff are used to evaluate the arteries that supply the arms and legs with blood. Allow thirty minutes for this exam. There are no restrictions or special instructions.  Please note: We ask at that you not bring children with you during ultrasound (echo/ vascular) testing. Due to room size and safety concerns, children are not allowed in the ultrasound rooms during exams. Our front office staff cannot provide observation of children in our lobby area while testing is being conducted. An adult accompanying a patient to their appointment will only be allowed in the ultrasound room at the discretion of the ultrasound technician under special circumstances. We apologize for any inconvenience.   Please note: We ask at that you not bring children with you during ultrasound (echo/ vascular) testing. Due to room size and safety concerns, children are  not allowed in the ultrasound rooms during exams. Our front office staff cannot provide observation of children in our lobby area while testing is being conducted. An adult accompanying a patient to their appointment will only be allowed in the ultrasound room at the discretion of the ultrasound technician under special circumstances. We apologize for any inconvenience.    Follow-Up: At [facility], you and your health needs are our priority.  As part of our continuing mission to provide you with exceptional heart care, we have created designated Provider Care Teams.  These Care Teams include your primary Cardiologist (physician) and Advanced Practice Providers (APPs -  Physician Assistants and Nurse Practitioners) who all work together to provide you with the care you need, when you need it.  We recommend signing up for the patient portal called "MyChart".  Sign up information is provided on this After Visit Summary.  MyChart is used to connect with patients for Virtual Visits (Telemedicine).  Patients are able to view lab/test results, encounter notes, upcoming appointments, etc.  Non-urgent messages can be sent to your provider as well.   To learn more about what you can do with MyChart, go to [email or web address].    Your next appointment:   6 month(s)  The format for your next appointment:   In Person  Provider:   Gypsy Balsam, MD    Other Instructions NA

## 2024-01-05 LAB — LIPID PANEL
Chol/HDL Ratio: 2.7 ratio (ref 0.0–5.0)
Cholesterol, Total: 149 mg/dL (ref 100–199)
HDL: 56 mg/dL (ref >39)
LDL Chol Calc (NIH): 79 mg/dL (ref 0–99)
Triglycerides: 73 mg/dL (ref 0–149)
VLDL Cholesterol Cal: 14 mg/dL (ref 5–40)

## 2024-01-05 LAB — AST: AST: 21 IU/L (ref 0–40)

## 2024-01-05 LAB — ALT: ALT: 21 IU/L (ref 0–44)

## 2024-01-10 ENCOUNTER — Telehealth: Payer: Self-pay

## 2024-01-10 NOTE — Telephone Encounter (Signed)
-----   Message from Ralene Burger sent at 01/08/2024 10:51 AM EDT ----- Cholesterol still elevated, increase Lipitor to 20 g daily, fasting lipid profile is TLT 6 weeks

## 2024-01-10 NOTE — Telephone Encounter (Signed)
 LM to return my call.

## 2024-01-11 ENCOUNTER — Other Ambulatory Visit: Payer: Self-pay

## 2024-01-11 DIAGNOSIS — E785 Hyperlipidemia, unspecified: Secondary | ICD-10-CM

## 2024-01-11 MED ORDER — ATORVASTATIN CALCIUM 20 MG PO TABS: 20.0000 mg | ORAL_TABLET | Freq: Every day | ORAL | 3 refills | Status: AC

## 2024-01-11 NOTE — Telephone Encounter (Signed)
 Pt returning call, requesting cb

## 2024-01-11 NOTE — Telephone Encounter (Signed)
 Patient informed of results. Lipitor medication ordered via Epic and sent to her pharmacy. Labs ordered via Epic.

## 2024-01-29 ENCOUNTER — Encounter (HOSPITAL_COMMUNITY): Payer: Self-pay

## 2024-02-05 ENCOUNTER — Ambulatory Visit: Attending: Cardiology

## 2024-02-05 ENCOUNTER — Encounter: Payer: Self-pay | Admitting: Cardiology

## 2024-02-05 DIAGNOSIS — M79606 Pain in leg, unspecified: Secondary | ICD-10-CM

## 2024-02-05 DIAGNOSIS — M79604 Pain in right leg: Secondary | ICD-10-CM | POA: Insufficient documentation

## 2024-02-06 LAB — VAS US ABI WITH/WO TBI
Left ABI: 1.12
Right ABI: 1.14

## 2024-02-07 ENCOUNTER — Ambulatory Visit: Payer: Self-pay | Admitting: Cardiology
